# Patient Record
Sex: Male | Born: 1935 | Race: White | Hispanic: No | Marital: Married | Smoking: Current every day smoker
Health system: Southern US, Community
[De-identification: ages and names within clinical notes are randomized; demographics above are authoritative.]

## PROBLEM LIST (undated history)

## (undated) DIAGNOSIS — I714 Abdominal aortic aneurysm, without rupture, unspecified: Secondary | ICD-10-CM

## (undated) DIAGNOSIS — N35919 Unspecified urethral stricture, male, unspecified site: Secondary | ICD-10-CM

## (undated) DIAGNOSIS — N390 Urinary tract infection, site not specified: Secondary | ICD-10-CM

## (undated) DIAGNOSIS — G459 Transient cerebral ischemic attack, unspecified: Secondary | ICD-10-CM

## (undated) DIAGNOSIS — D494 Neoplasm of unspecified behavior of bladder: Secondary | ICD-10-CM

## (undated) DIAGNOSIS — H3530 Unspecified macular degeneration: Secondary | ICD-10-CM

## (undated) DIAGNOSIS — F0390 Unspecified dementia without behavioral disturbance: Secondary | ICD-10-CM

## (undated) HISTORY — DX: Abdominal aortic aneurysm, without rupture: I71.4

## (undated) HISTORY — DX: Neoplasm of unspecified behavior of bladder: D49.4

## (undated) HISTORY — PX: HERNIA REPAIR: SHX51

## (undated) HISTORY — DX: Abdominal aortic aneurysm, without rupture, unspecified: I71.40

## (undated) HISTORY — DX: Unspecified dementia, unspecified severity, without behavioral disturbance, psychotic disturbance, mood disturbance, and anxiety: F03.90

## (undated) HISTORY — DX: Unspecified urethral stricture, male, unspecified site: N35.919

## (undated) HISTORY — PX: CYST EXCISION: SHX5701

---

## 2001-11-28 ENCOUNTER — Ambulatory Visit (HOSPITAL_COMMUNITY): Admission: RE | Admit: 2001-11-28 | Discharge: 2001-11-29 | Payer: Self-pay | Admitting: Surgery

## 2008-05-19 ENCOUNTER — Encounter: Admission: RE | Admit: 2008-05-19 | Discharge: 2008-05-19 | Payer: Self-pay | Admitting: Neurosurgery

## 2011-01-07 NOTE — Op Note (Signed)
[place]  Patient:    Shawn [name], Shawn [name] Visit Number: [id number] MRN: [id number]          Service Type: DSU Location: [phone] Attending Physician:  Bonnetta Barry Dictated by:   Velora Heckler, M.D. Proc. Date: 11/28/01 Admit Date:  11/28/2001   CC:         Payton Doughty, M.D.  Colon Flattery, D.O.   Operative Report  PREOPERATIVE DIAGNOSIS:  Left inguinal hernia.  POSTOPERATIVE DIAGNOSIS:  Left inguinal hernia.  OPERATION PERFORMED:  Repair of left inguinal hernia with Prolene.  SURGEON:  Velora Heckler, M.D.  ANESTHESIA:  General.  ESTIMATED BLOOD LOSS:  Minimal.  PREPARATION:  Betadine.  COMPLICATIONS:  None.  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  This has been present for four to five years and gradually increasing in size.  The patient does note bowel sounds in the ground.  It has always been reducible.  He has had no symptoms of obstruction.  He now comes to surgery for herniorrhaphy.  DESCRIPTION OF PROCEDURE:  The procedure was done in OR #16 at the [facility].  The patient was brought to the operating room and placed in supine position on the operating room table.  Following administration of general anesthesia, the patient was prepped and draped in the usual strict aseptic fashion.  After ascertaining that an adequate level of anesthesia had been obtained, a left inguinal incision was made with a #10 blade.  Dissection was carried down through subcutaneous tissues and hemostasis was obtained with the electrocautery.  External oblique fascia was incised in line with its fibers and extended through the external inguinal ring.  Care was taken to preserve the ilioinguinal nerve.  Cord structures were dissected out.  Cord structures were encircled with a Penrose drain.  The floor of the inguinal canal was  dissected out.  There was a large direct inguinal hernia.  This was dissected out circumferentially defining the fascial plane.  The cord was explored and a small lipoma of the cord was excised at the level of the internal inguinal ring and ligated with a 3-0 Vicryl suture ligature.  The hernia was reduced and held in reduction with interrupted 3-0 Vicryl figure-of-eight sutures.  Next, the floor of the inguinal canal was recreated with a sheet of Prolene mesh.  The mesh was cut to the  appropriate dimensions.  It was secured to the pubic tubercle and along the inguinal ligament with a running 2-0 Novofil suture.  Fascia was split to accommodate the cord structures.  The superior margin of the mesh was secured with a transversalis and internal oblique fascia with interrupted 2-0 Novofil sutures.  The tails of the mesh were overlapped lateral to the cord structures and the inferior edges were secured to the inguinal ligament with a single interrupted 2-0 Novofil suture placed lateral to the cord structures. Good hemostasis was noted.  Local field block was placed with Marcaine.  The ilioinguinal nerve and cord structures were returned to the inguinal canal. The external oblique fascia was closed with interrupted 3-0 Vicryl sutures. Subcutaneous tissues were closed with interrupted 3-0 Vicryl sutures.  The skin edges were anesthetized with local anesthetic.  Skin edges were reapproximated with interrupted 4-0 Vicryl subcuticular sutures.  The wound was washed and dried and benzoin and Steri-Strips were applied.  Sterile gauze dressings were applied.  The patient was awakened from anesthesia and brought to the recovery room in stable condition.  The patient tolerated the procedure well. Dictated by:   Velora Heckler, M.D. Attending Physician:  Bonnetta Barry DD:  11/28/01 TD:  11/28/01 Job: [id number]

## 2011-08-23 DIAGNOSIS — G459 Transient cerebral ischemic attack, unspecified: Secondary | ICD-10-CM

## 2011-08-23 HISTORY — DX: Transient cerebral ischemic attack, unspecified: G45.9

## 2011-09-22 ENCOUNTER — Ambulatory Visit (HOSPITAL_COMMUNITY)
Admission: RE | Admit: 2011-09-22 | Discharge: 2011-09-22 | Disposition: A | Discharge status: Home / Self Care | Payer: Medicare Other | Source: Ambulatory Visit | Attending: Neurosurgery | Admitting: Neurosurgery

## 2011-09-22 DIAGNOSIS — G459 Transient cerebral ischemic attack, unspecified: Secondary | ICD-10-CM

## 2011-09-22 NOTE — Progress Notes (Signed)
Bilateral carotid artery duplex completed.  Preliminary report is 60-79% ICA stenosis on the right, possibly due to vessel tortuosity, and no evidence of significant stenosis on the left.

## 2013-09-25 ENCOUNTER — Encounter: Payer: Self-pay | Admitting: Vascular Surgery

## 2013-09-29 ENCOUNTER — Observation Stay (HOSPITAL_COMMUNITY)
Admission: EM | Admit: 2013-09-29 | Discharge: 2013-09-30 | Disposition: A | Discharge status: Home / Self Care | Payer: Medicare Other | Source: Home / Self Care | Attending: Emergency Medicine | Admitting: Emergency Medicine

## 2013-09-29 ENCOUNTER — Emergency Department (HOSPITAL_COMMUNITY): Payer: Medicare Other

## 2013-09-29 DIAGNOSIS — Z23 Encounter for immunization: Secondary | ICD-10-CM

## 2013-09-29 DIAGNOSIS — D494 Neoplasm of unspecified behavior of bladder: Principal | ICD-10-CM | POA: Diagnosis present

## 2013-09-29 DIAGNOSIS — N390 Urinary tract infection, site not specified: Secondary | ICD-10-CM

## 2013-09-29 DIAGNOSIS — I10 Essential (primary) hypertension: Secondary | ICD-10-CM | POA: Diagnosis present

## 2013-09-29 DIAGNOSIS — D649 Anemia, unspecified: Secondary | ICD-10-CM

## 2013-09-29 DIAGNOSIS — Z8673 Personal history of transient ischemic attack (TIA), and cerebral infarction without residual deficits: Secondary | ICD-10-CM

## 2013-09-29 DIAGNOSIS — I714 Abdominal aortic aneurysm, without rupture, unspecified: Secondary | ICD-10-CM | POA: Diagnosis present

## 2013-09-29 DIAGNOSIS — R319 Hematuria, unspecified: Secondary | ICD-10-CM

## 2013-09-29 DIAGNOSIS — G309 Alzheimer's disease, unspecified: Secondary | ICD-10-CM | POA: Diagnosis present

## 2013-09-29 DIAGNOSIS — R339 Retention of urine, unspecified: Secondary | ICD-10-CM | POA: Diagnosis not present

## 2013-09-29 DIAGNOSIS — R310 Gross hematuria: Secondary | ICD-10-CM | POA: Diagnosis present

## 2013-09-29 DIAGNOSIS — F17200 Nicotine dependence, unspecified, uncomplicated: Secondary | ICD-10-CM | POA: Diagnosis present

## 2013-09-29 DIAGNOSIS — Z820 Family history of epilepsy and other diseases of the nervous system: Secondary | ICD-10-CM

## 2013-09-29 DIAGNOSIS — D696 Thrombocytopenia, unspecified: Secondary | ICD-10-CM | POA: Diagnosis not present

## 2013-09-29 DIAGNOSIS — R32 Unspecified urinary incontinence: Secondary | ICD-10-CM | POA: Diagnosis not present

## 2013-09-29 DIAGNOSIS — N401 Enlarged prostate with lower urinary tract symptoms: Secondary | ICD-10-CM | POA: Diagnosis present

## 2013-09-29 DIAGNOSIS — F0280 Dementia in other diseases classified elsewhere without behavioral disturbance: Secondary | ICD-10-CM | POA: Diagnosis present

## 2013-09-29 DIAGNOSIS — N138 Other obstructive and reflux uropathy: Secondary | ICD-10-CM | POA: Diagnosis present

## 2013-09-29 DIAGNOSIS — D62 Acute posthemorrhagic anemia: Secondary | ICD-10-CM | POA: Diagnosis present

## 2013-09-29 DIAGNOSIS — F411 Generalized anxiety disorder: Secondary | ICD-10-CM | POA: Diagnosis present

## 2013-09-29 HISTORY — DX: Transient cerebral ischemic attack, unspecified: G45.9

## 2013-09-29 HISTORY — DX: Unspecified macular degeneration: H35.30

## 2013-09-29 MED ORDER — IOHEXOL 350 MG/ML SOLN
100.0000 mL | Freq: Once | INTRAVENOUS | Status: DC | PRN
  Administered 2013-09-29: 100 mL via INTRAVENOUS

## 2013-09-29 MED ORDER — SODIUM CHLORIDE 0.9 % IV BOLUS (SEPSIS)
500.0000 mL | INTRAVENOUS | Status: DC
  Administered 2013-09-29: 500 mL via INTRAVENOUS

## 2013-09-29 MED ORDER — DEXTROSE 5 % IV SOLN
1.0000 g | Freq: Once | INTRAVENOUS | Status: DC
  Administered 2013-09-29: 1 g via INTRAVENOUS
  Filled 2013-09-29: qty 10

## 2013-09-29 NOTE — ED Provider Notes (Signed)
CSN: [id number]     Arrival date & time 09/29/13  1903 History   First MD Initiated Contact with Patient 09/29/13 1931     Chief Complaint  Patient presents with  . Hematuria  . Urinary Retention   (Consider location/radiation/quality/duration/timing/severity/associated sxs/prior Treatment) Patient is a 80 y.o. male presenting with hematuria. The history is provided by the patient and the spouse.  Hematuria This is a new problem. The current episode started more than 1 week ago. The problem occurs constantly. The problem has not changed since onset.Pertinent negatives include no chest pain, no abdominal pain, no headaches and no shortness of breath. Nothing aggravates the symptoms. Nothing relieves the symptoms. He has tried nothing for the symptoms. The treatment provided no relief.    Past Medical History  Diagnosis Date  . TIA (transient ischemic attack)   . Macular degeneration    Past Surgical History  Procedure Laterality Date  . Hernia repair     History reviewed. No pertinent family history. History  Substance Use Topics  . Smoking status: Current Every Day Smoker  . Smokeless tobacco: Not on file  . Alcohol Use: No    Review of Systems  Constitutional: Negative for fever.  HENT: Negative for drooling and rhinorrhea.   Eyes: Negative for pain.  Respiratory: Negative for cough and shortness of breath.   Cardiovascular: Negative for chest pain and leg swelling.  Gastrointestinal: Negative for nausea, vomiting, abdominal pain and diarrhea.  Genitourinary: Positive for hematuria. Negative for dysuria.  Musculoskeletal: Negative for gait problem and neck pain.  Skin: Negative for color change.  Neurological: Positive for dizziness. Negative for numbness and headaches.  Hematological: Negative for adenopathy.  Psychiatric/Behavioral: Negative for behavioral problems.  All other systems reviewed and are negative.    Allergies  Review of patient's allergies indicates  no known allergies.  Home Medications  No current outpatient prescriptions on file. BP 163/78  Pulse 89  Temp(Src) 98 F (36.7 C) (Oral)  Resp 27  SpO2 98% Physical Exam  Nursing note and vitals reviewed. Constitutional: He is oriented to person, place, and time. He appears well-developed and well-nourished.  HENT:  Head: Normocephalic and atraumatic.  Right Ear: External ear normal.  Left Ear: External ear normal.  Nose: Nose normal.  Mouth/Throat: Oropharynx is clear and moist. No oropharyngeal exudate.  Eyes: Conjunctivae and EOM are normal. Pupils are equal, round, and reactive to light.  Neck: Normal range of motion. Neck supple.  Cardiovascular: Normal rate, regular rhythm, normal heart sounds and intact distal pulses.  Exam reveals no gallop and no friction rub.   No murmur heard. Pulmonary/Chest: Effort normal and breath sounds normal. No respiratory distress. He has no wheezes.  Abdominal: Soft. Bowel sounds are normal. He exhibits no distension. There is no tenderness. There is no rebound and no guarding.  Genitourinary: Penis normal. No penile tenderness.  Normal appearing testicles.  Musculoskeletal: Normal range of motion. He exhibits no edema and no tenderness.  Neurological: He is alert and oriented to person, place, and time.  Skin: Skin is warm and dry.  Psychiatric: He has a normal mood and affect. His behavior is normal.    ED Course  Procedures (including critical care time) Labs Review Labs Reviewed  CBC WITH DIFFERENTIAL - Abnormal; Notable for the following:    RBC 2.69 (*)    Hemoglobin 8.0 (*)    HCT 24.5 (*)    RDW 16.2 (*)    Platelets 59 (*)    All  other components within normal limits  COMPREHENSIVE METABOLIC PANEL - Abnormal; Notable for the following:    Glucose, Bld 123 (*)    GFR calc non Af Amer 77 (*)    GFR calc Af Amer 90 (*)    All other components within normal limits   Imaging Review No results found.  EKG Interpretation    None       MDM   1. Hematuria   2. UTI (lower urinary tract infection)   3. AAA (abdominal aortic aneurysm)   4. Anemia    8:01 PM 80 y.o. male presents with hematuria which began approximately one week ago. The patient began having hematuria back in December of 2014. He had a CT scan 2 weeks ago which found a abdominal aortic aneurysm 59 x 57 mm and a tumor on his bladder wall which was suspected to be the cause of the bleeding. The family notes that he has had continued and worsening hematuria and presented for evaluation due to this. The patient denies any pain in the family states that he has been well otherwise. He is afebrile and vital signs are unremarkable here. He has no complaints on exam. Will get screening labwork and CT of abd to r/o any worsening of AAA or fistula given worsening of hematuria.   Will plan on admission given anemia and worsening hematuria. Case discussed w/ on call Urology who will see the patient tomorrow.    Blanchard Kelch, MD 10/11/13 1104

## 2013-09-29 NOTE — ED Notes (Signed)
Pt still waiting for  c-t.  Rocephin has infused

## 2013-09-29 NOTE — ED Notes (Signed)
The pt has had bloody urine since January.  He was diagnosed with a tumor in his bladder but no surgery can be performed until he has a repair of his aaa.  More blood today with clots.  No pain according to the family at the bedside.  The pt has dementia.  The pt is alert no distress comfortable

## 2013-09-29 NOTE — ED Notes (Addendum)
C/o hematuria and urinary retention, reports some pain, sx intermittant since 07/31/13. Has seen urology. Recent CT scan shows surgical bladder tumor. Reports some pain. Wife present. Describes pt as fall risk, pt has alzheimers. Wife has disc of CT done at [place].  

## 2013-09-29 NOTE — ED Notes (Signed)
Assisted pt with using urinal pt was unable to urinate.

## 2013-09-29 NOTE — ED Notes (Signed)
Patient transported to CT 

## 2013-09-29 NOTE — ED Notes (Signed)
The pt continues to c/o needing to urinate.  Family remains at the bedside

## 2013-09-30 ENCOUNTER — Emergency Department (HOSPITAL_COMMUNITY): Payer: Medicare Other

## 2013-09-30 ENCOUNTER — Encounter (HOSPITAL_COMMUNITY): Payer: Self-pay | Admitting: Radiology

## 2013-09-30 ENCOUNTER — Inpatient Hospital Stay (HOSPITAL_COMMUNITY)
Admission: EM | Admit: 2013-09-30 | Discharge: 2013-10-03 | DRG: 669 | Disposition: A | Discharge status: Home / Self Care | Payer: Medicare Other | Attending: Internal Medicine | Admitting: Internal Medicine

## 2013-09-30 ENCOUNTER — Observation Stay (HOSPITAL_COMMUNITY): Payer: Medicare Other

## 2013-09-30 DIAGNOSIS — I714 Abdominal aortic aneurysm, without rupture, unspecified: Secondary | ICD-10-CM

## 2013-09-30 DIAGNOSIS — N390 Urinary tract infection, site not specified: Secondary | ICD-10-CM

## 2013-09-30 DIAGNOSIS — G309 Alzheimer's disease, unspecified: Secondary | ICD-10-CM

## 2013-09-30 DIAGNOSIS — D62 Acute posthemorrhagic anemia: Secondary | ICD-10-CM

## 2013-09-30 DIAGNOSIS — R319 Hematuria, unspecified: Secondary | ICD-10-CM | POA: Diagnosis present

## 2013-09-30 DIAGNOSIS — F0280 Dementia in other diseases classified elsewhere without behavioral disturbance: Secondary | ICD-10-CM | POA: Diagnosis present

## 2013-09-30 DIAGNOSIS — N3289 Other specified disorders of bladder: Secondary | ICD-10-CM | POA: Diagnosis present

## 2013-09-30 HISTORY — DX: Urinary tract infection, site not specified: N39.0

## 2013-09-30 HISTORY — DX: Transient cerebral ischemic attack, unspecified: G45.9

## 2013-09-30 HISTORY — DX: Unspecified macular degeneration: H35.30

## 2013-09-30 LAB — COMPREHENSIVE METABOLIC PANEL
ALK PHOS: 60 U/L (ref 39–117)
ALT: 14 U/L (ref 0–53)
ALT: 11 U/L (ref 0–53)
AST: 22 U/L (ref 0–37)
AST: 17 U/L (ref 0–37)
Albumin: 4 g/dL (ref 3.5–5.2)
Albumin: 3.8 g/dL (ref 3.5–5.2)
Alkaline Phosphatase: 66 U/L (ref 39–117)
BUN: 11 mg/dL (ref 6–23)
BUN: 10 mg/dL (ref 6–23)
CALCIUM: 8.6 mg/dL (ref 8.4–10.5)
CO2: 26 mEq/L (ref 19–32)
CO2: 24 meq/L (ref 19–32)
Calcium: 9.1 mg/dL (ref 8.4–10.5)
Chloride: 104 mEq/L (ref 96–112)
Chloride: 103 mEq/L (ref 96–112)
Creatinine, Ser: 0.94 mg/dL (ref 0.50–1.35)
Creatinine, Ser: 0.85 mg/dL (ref 0.50–1.35)
GFR calc Af Amer: 90 mL/min — ABNORMAL LOW (ref >90)
GFR calc Af Amer: >90 mL/min (ref >90)
GFR calc non Af Amer: 77 mL/min — ABNORMAL LOW (ref >90)
GFR, EST NON AFRICAN AMERICAN: 81 mL/min — AB (ref >90)
GLUCOSE: 124 mg/dL — AB (ref 70–99)
Glucose, Bld: 123 mg/dL — ABNORMAL HIGH (ref 70–99)
POTASSIUM: 4.1 meq/L (ref 3.7–5.3)
Potassium: 4.4 mEq/L (ref 3.7–5.3)
SODIUM: 141 meq/L (ref 137–147)
Sodium: 143 mEq/L (ref 137–147)
Total Bilirubin: 0.4 mg/dL (ref 0.3–1.2)
Total Bilirubin: 0.4 mg/dL (ref 0.3–1.2)
Total Protein: 7.2 g/dL (ref 6.0–8.3)
Total Protein: 6.8 g/dL (ref 6.0–8.3)

## 2013-09-30 LAB — URINALYSIS, ROUTINE W REFLEX MICROSCOPIC
Glucose, UA: NEGATIVE mg/dL
KETONES UR: 40 mg/dL — AB
Nitrite: POSITIVE — AB
PH: 6.5 (ref 5.0–8.0)
Protein, ur: >300 mg/dL — AB
SPECIFIC GRAVITY, URINE: 1.018 (ref 1.005–1.030)
UROBILINOGEN UA: 1 mg/dL (ref 0.0–1.0)

## 2013-09-30 LAB — URINE MICROSCOPIC-ADD ON

## 2013-09-30 LAB — CBC
HCT: 23.5 % — ABNORMAL LOW (ref 39.0–52.0)
HCT: 21.8 % — ABNORMAL LOW (ref 39.0–52.0)
HEMATOCRIT: 22.5 % — AB (ref 39.0–52.0)
HEMOGLOBIN: 7.9 g/dL — AB (ref 13.0–17.0)
HEMOGLOBIN: 7.5 g/dL — AB (ref 13.0–17.0)
Hemoglobin: 7.1 g/dL — ABNORMAL LOW (ref 13.0–17.0)
MCH: 30.7 pg (ref 26.0–34.0)
MCH: 30.5 pg (ref 26.0–34.0)
MCH: 29.7 pg (ref 26.0–34.0)
MCHC: 33.6 g/dL (ref 30.0–36.0)
MCHC: 33.3 g/dL (ref 30.0–36.0)
MCHC: 32.6 g/dL (ref 30.0–36.0)
MCV: 91.4 fL (ref 78.0–100.0)
MCV: 91.5 fL (ref 78.0–100.0)
MCV: 91.2 fL (ref 78.0–100.0)
Platelets: 45 10*3/uL — ABNORMAL LOW (ref 150–400)
Platelets: 41 10*3/uL — ABNORMAL LOW (ref 150–400)
Platelets: 46 10*3/uL — ABNORMAL LOW (ref 150–400)
RBC: 2.57 MIL/uL — ABNORMAL LOW (ref 4.22–5.81)
RBC: 2.46 MIL/uL — ABNORMAL LOW (ref 4.22–5.81)
RBC: 2.39 MIL/uL — ABNORMAL LOW (ref 4.22–5.81)
RDW: 16.3 % — ABNORMAL HIGH (ref 11.5–15.5)
RDW: 16.2 % — ABNORMAL HIGH (ref 11.5–15.5)
RDW: 16.4 % — ABNORMAL HIGH (ref 11.5–15.5)
WBC: 7.9 10*3/uL (ref 4.0–10.5)
WBC: 6.9 10*3/uL (ref 4.0–10.5)
WBC: 5.4 10*3/uL (ref 4.0–10.5)

## 2013-09-30 LAB — POCT I-STAT, CHEM 8
BUN: 8 mg/dL (ref 6–23)
Calcium, Ion: 1.18 mmol/L (ref 1.13–1.30)
Chloride: 102 mEq/L (ref 96–112)
Creatinine, Ser: 1 mg/dL (ref 0.50–1.35)
Glucose, Bld: 119 mg/dL — ABNORMAL HIGH (ref 70–99)
HEMATOCRIT: 26 % — AB (ref 39.0–52.0)
HEMOGLOBIN: 8.8 g/dL — AB (ref 13.0–17.0)
Potassium: 4 mEq/L (ref 3.7–5.3)
SODIUM: 142 meq/L (ref 137–147)
TCO2: 25 mmol/L (ref 0–100)

## 2013-09-30 LAB — CBC WITH DIFFERENTIAL/PLATELET
BASOS ABS: 0 10*3/uL (ref 0.0–0.1)
Basophils Absolute: 0 10*3/uL (ref 0.0–0.1)
Basophils Relative: 0 % (ref 0–1)
Basophils Relative: 0 % (ref 0–1)
EOS ABS: 0 10*3/uL (ref 0.0–0.7)
Eosinophils Absolute: 0.1 10*3/uL (ref 0.0–0.7)
Eosinophils Relative: 1 % (ref 0–5)
Eosinophils Relative: 0 % (ref 0–5)
HCT: 24.5 % — ABNORMAL LOW (ref 39.0–52.0)
HCT: 24.7 % — ABNORMAL LOW (ref 39.0–52.0)
Hemoglobin: 8 g/dL — ABNORMAL LOW (ref 13.0–17.0)
Hemoglobin: 8.1 g/dL — ABNORMAL LOW (ref 13.0–17.0)
LYMPHS PCT: 27 % (ref 12–46)
Lymphocytes Relative: 24 % (ref 12–46)
Lymphs Abs: 1.5 10*3/uL (ref 0.7–4.0)
Lymphs Abs: 1.4 10*3/uL (ref 0.7–4.0)
MCH: 29.7 pg (ref 26.0–34.0)
MCH: 30 pg (ref 26.0–34.0)
MCHC: 32.7 g/dL (ref 30.0–36.0)
MCHC: 32.8 g/dL (ref 30.0–36.0)
MCV: 91.1 fL (ref 78.0–100.0)
MCV: 91.5 fL (ref 78.0–100.0)
Monocytes Absolute: 0.5 10*3/uL (ref 0.1–1.0)
Monocytes Absolute: 0.4 10*3/uL (ref 0.1–1.0)
Monocytes Relative: 8 % (ref 3–12)
Monocytes Relative: 7 % (ref 3–12)
NEUTROS PCT: 66 % (ref 43–77)
Neutro Abs: 4.2 10*3/uL (ref 1.7–7.7)
Neutro Abs: 3.4 10*3/uL (ref 1.7–7.7)
Neutrophils Relative %: 67 % (ref 43–77)
Platelets: 59 10*3/uL — ABNORMAL LOW (ref 150–400)
Platelets: 55 10*3/uL — ABNORMAL LOW (ref 150–400)
RBC: 2.69 MIL/uL — ABNORMAL LOW (ref 4.22–5.81)
RBC: 2.7 MIL/uL — ABNORMAL LOW (ref 4.22–5.81)
RDW: 16.2 % — ABNORMAL HIGH (ref 11.5–15.5)
RDW: 16.3 % — AB (ref 11.5–15.5)
WBC: 6.3 10*3/uL (ref 4.0–10.5)
WBC: 5.2 10*3/uL (ref 4.0–10.5)

## 2013-09-30 LAB — ABO/RH: ABO/RH(D): A POS

## 2013-09-30 MED ORDER — ACETAMINOPHEN 325 MG PO TABS
650.0000 mg | ORAL_TABLET | Freq: Four times a day (QID) | ORAL | Status: DC | PRN
  Administered 2013-10-01 – 2013-10-02 (×2): 650 mg via ORAL
  Filled 2013-09-30 (×2): qty 2

## 2013-09-30 MED ORDER — ALPRAZOLAM 0.5 MG PO TABS
0.5000 mg | ORAL_TABLET | Freq: Three times a day (TID) | ORAL | Status: DC | PRN
  Administered 2013-09-30: 0.5 mg via ORAL
  Filled 2013-09-30: qty 1

## 2013-09-30 MED ORDER — IOHEXOL 350 MG/ML SOLN
100.0000 mL | Freq: Once | INTRAVENOUS | Status: AC | PRN
  Administered 2013-09-30: 100 mL via INTRAVENOUS

## 2013-09-30 MED ORDER — SODIUM CHLORIDE 0.9 % IV BOLUS (SEPSIS)
500.0000 mL | Freq: Once | INTRAVENOUS | Status: AC
  Administered 2013-09-29: 500 mL via INTRAVENOUS

## 2013-09-30 MED ORDER — DEXTROSE 5 % IV SOLN
1.0000 g | INTRAVENOUS | Status: DC
  Administered 2013-09-30 – 2013-10-01 (×2): 1 g via INTRAVENOUS
  Filled 2013-09-30 (×3): qty 10

## 2013-09-30 MED ORDER — INFLUENZA VAC SPLIT QUAD 0.5 ML IM SUSP
0.5000 mL | INTRAMUSCULAR | Status: AC
  Administered 2013-10-01: 0.5 mL via INTRAMUSCULAR
  Filled 2013-09-30: qty 0.5

## 2013-09-30 MED ORDER — ONDANSETRON HCL 4 MG PO TABS: 4.0000 mg | ORAL_TABLET | Freq: Four times a day (QID) | ORAL | Status: DC | PRN

## 2013-09-30 MED ORDER — ONDANSETRON HCL 4 MG/2ML IJ SOLN: 4.0000 mg | Freq: Four times a day (QID) | INTRAMUSCULAR | Status: DC | PRN

## 2013-09-30 MED ORDER — ACETAMINOPHEN 650 MG RE SUPP: 650.0000 mg | Freq: Four times a day (QID) | RECTAL | Status: DC | PRN

## 2013-09-30 MED ORDER — OCUVITE PO TABS
1.0000 | ORAL_TABLET | Freq: Every day | ORAL | Status: DC
  Administered 2013-09-30 – 2013-10-03 (×3): 1 via ORAL
  Filled 2013-09-30 (×4): qty 1

## 2013-09-30 MED ORDER — SODIUM CHLORIDE 0.9 % IV SOLN
INTRAVENOUS | Status: AC
  Administered 2013-09-30 – 2013-10-01 (×3): via INTRAVENOUS

## 2013-09-30 MED ORDER — PNEUMOCOCCAL VAC POLYVALENT 25 MCG/0.5ML IJ INJ
0.5000 mL | INJECTION | INTRAMUSCULAR | Status: AC
  Administered 2013-10-01: 0.5 mL via INTRAMUSCULAR
  Filled 2013-09-30: qty 0.5

## 2013-09-30 MED ORDER — DEXTROSE 5 % IV SOLN
1.0000 g | Freq: Once | INTRAVENOUS | Status: AC
  Administered 2013-09-29: 1 g via INTRAVENOUS

## 2013-09-30 MED ORDER — ALPRAZOLAM 0.5 MG PO TABS
1.0000 mg | ORAL_TABLET | Freq: Two times a day (BID) | ORAL | Status: DC | PRN
  Administered 2013-09-30 – 2013-10-03 (×6): 1 mg via ORAL
  Filled 2013-09-30 (×6): qty 2

## 2013-09-30 NOTE — ED Notes (Signed)
Unable to placed Iv in patient arm one  Fail attempt

## 2013-09-30 NOTE — ED Notes (Signed)
The pt has had bloody urine since January when he was diagnosed with a mass in his bladder that cannot be repaired until  His aaa is repaired.  The blood ahd been bright with clotting for the past 2 days worse today with frequency and voiding in small amounts

## 2013-09-30 NOTE — Progress Notes (Signed)
Pt continues to have dark red urine.

## 2013-09-30 NOTE — H&P (Signed)
Please note patient has another MRN: [id number]  Patient's PCP: Glenna Fellows, MD Patient's urologist: Dr. Clyde Lundborg, [facility]  Chief Complaint: Blood in urine  History of Present Illness: Shawn [name] is a 80 y.o. Caucasian male with history of dementia, recent diagnoses of abdominal aortic aneurysm, and bladder mass who presents with the above complaints.  Patient due to his dementia and cannot provide any history, most of the history was provided by wife at bedside.  She indicated that patient had hematuria in December of 2014 which lasted for one week to 10 days.  She followed up with Dr. Exie Parody, urology.  Patient had CT done about 2 weeks ago which showed that he had abdominal aortic aneurysm measuring 59 x 57 mm and the tumor in his bladder wall.  Due to patient's abdominal aortic aneurysm, urology deferred further urologic workup until abdominal aortic aneurysm was repaired.  Wife are arranged for the patient to have an appointment with Dr. Donnetta Hutching, vascular surgery in the middle of February.  In the meanwhile, patient restarted having hematuria early January initially was pink tinged since then has progressed bright red blood.  As a result patient was brought to the emergency department for further evaluation.  Workup in the emergency department showed infrarenal abdominal aortic aneurysm measuring 62 mm x 57 mm and bladder mass tumor.  He was also found to be anemic with a hemoglobin of 8.1.  ED provider discussed with on-call urology who recommended medical admission.  Review of Systems: All systems reviewed with the patient and positive as per history of present illness, otherwise all other systems are negative.  Past Medical History  Diagnosis Date  . AAA (abdominal aortic aneurysm)   . Dementia, unspecified, without behavioral disturbance   . Neoplasm of unspecified nature of bladder   . Urethral stricture unspecified    History reviewed. No pertinent past surgical history. Family  History  Problem Relation Age of Onset  . Alzheimer's disease Mother   . Suicidality Father    History   Social History  . Marital Status: Married    Spouse Name: N/A    Number of Children: N/A  . Years of Education: N/A   Occupational History  . Not on file.   Social History Main Topics  . Smoking status: Current Every Day Smoker -- 1.00 packs/day  . Smokeless tobacco: Not on file  . Alcohol Use: No  . Drug Use: Not on file  . Sexual Activity: Not on file   Other Topics Concern  . Not on file   Social History Narrative  . No narrative on file   Allergies: Review of patient's allergies indicates no known allergies.  Home Meds: Prior to Admission medications   Medication Sig Start Date End Date Taking? Authorizing Provider  ALPRAZolam Duanne Moron) 0.5 MG tablet Take 0.5 mg by mouth 2 (two) times daily.  09/25/13  Yes Historical Provider, MD  beta carotene w/minerals (OCUVITE) tablet Take 1 tablet by mouth daily.   Yes Historical Provider, MD    Physical Exam: Blood pressure 165/80, pulse 77, temperature 98 F (36.7 C), temperature source Oral, resp. rate 20, SpO2 94.00%. General: Awake, not oriented x3, No acute distress. HEENT: EOMI, Moist mucous membranes Neck: Supple CV: S1 and S2 Lungs: Clear to ascultation bilaterally Abdomen: Soft, Nontender, Nondistended, +bowel sounds. Ext: Good pulses. Trace edema. No clubbing or cyanosis noted. Neuro: Cranial Nerves II-XII grossly intact. Has 5/5 motor strength in upper and lower extremities.  Lab results:  Recent Labs  09/30/13 0133 09/30/13 0138  NA 141 142  K 4.1 4.0  CL 103 102  CO2 24  --   GLUCOSE 124* 119*  BUN 10 8  CREATININE 0.85 1.00  CALCIUM 8.6  --     Recent Labs  09/30/13 0133  AST 17  ALT 11  ALKPHOS 60  BILITOT 0.4  PROT 6.8  ALBUMIN 3.8   No results found for this basename: LIPASE, AMYLASE,  in the last 72 hours  Recent Labs  09/30/13 0133 09/30/13 0138  WBC 5.2  --   NEUTROABS  3.4  --   HGB 8.1* 8.8*  HCT 24.7* 26.0*  MCV 91.5  --   PLT 55*  --    No results found for this basename: CKTOTAL, CKMB, CKMBINDEX, TROPONINI,  in the last 72 hours No components found with this basename: POCBNP,  No results found for this basename: DDIMER,  in the last 72 hours No results found for this basename: HGBA1C,  in the last 72 hours No results found for this basename: CHOL, HDL, LDLCALC, TRIG, CHOLHDL, LDLDIRECT,  in the last 72 hours No results found for this basename: TSH, T4TOTAL, FREET3, T3FREE, THYROIDAB,  in the last 72 hours No results found for this basename: VITAMINB12, FOLATE, FERRITIN, TIBC, IRON, RETICCTPCT,  in the last 72 hours Imaging results:  Ct Cta Abd/pel W/cm &/or W/o Cm  09/30/2013   CLINICAL DATA:  Hematuria in urinary retention. Abdominal aortic aneurysm.  EXAM: CT ANGIOGRAPHY ABDOMEN AND PELVIS WITH CONTRAST AND WITHOUT CONTRAST  TECHNIQUE: Multidetector CT imaging of the abdomen and pelvis was performed using the standard protocol during bolus administration of intravenous contrast. Multiplanar reconstructed images including MIPs were obtained and reviewed to evaluate the vascular anatomy.  CONTRAST:  12mL OMNIPAQUE IOHEXOL 350 MG/ML SOLN  COMPARISON:  09/18/2013.  FINDINGS: Lung bases show dependent atelectasis. Liver is grossly within normal limits. Sludge layers within a Phrygian cap in the gallbladder. The spleen appears within normal limits. Adrenal glands are normal. Kidneys appear within normal limits aside from a left renal cysts. Ureters appear normal without hydronephrosis or hydroureter. Grossly, the stomach and small bowel appear normal. Pancreas and common bile duct are within normal limits.  Infrarenal abdominal aortic aneurysm is present. There is severe aortic and visceral atherosclerosis. 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 No  occlusion are as evidence of mesenteric ischemia.  Infrarenal abdominal aortic aneurysm shows no evidence of rupture or leak. Aneurysm measures 62 mm x 57 mm at its largest. Previously this was reported at 16 mm and this is unchanged when reviewing the prior examination and measured at the same level as today's study. Extensive mural thrombus and plaque is present. The aorta assumes a more normal caliber at the bifurcation. Right common iliac artery ulcerated plaque is present. There is no dissection.  There is a right inguinal hernia that contains the appendix.  Urinary bladder demonstrates soft tissue density along the right lateral and posterior wall compatible with known bladder tumor. Prostatomegaly with prostate calcifications. No aggressive osseous lesions. Lumbar spondylosis. Bilateral hip osteoarthritis.  Review of the MIP images confirms the above findings.  IMPRESSION: 1. 62 mm x 57 mm infrarenal abdominal aortic aneurysm. No dissection or evidence of rupture. No acute vascular findings. 2. Bladder base tumor previously evaluated on CT urogram. 3. Right inguinal hernia containing the appendix.   Electronically Signed  By: Dereck Ligas M.D.   On: 09/30/2013 02:44   Assessment & Plan by Problem: Hematuria likely due to bladder mass ED physician noted that the urologist did not want catheter at this time and wanted his urinary tract infection addressed.  Hydrate the patient on IV fluids.  Cycle CBC.  Will type and screen the patient in the event he may need transfusion.  Urology may need to be reconsulted as patient was on a different name emergency department due to error in registration.  Urinary tract infection Start the patient on ceftriaxone.  Further titration of an otherwise depending on culture results and sensitivities.  Thrombocytopenia? Unclear etiology.  Uncertain if this is related to hematuria.  Continue to monitor, if still low, may need further evaluation.  Acute blood loss  anemia As indicated above, continue to monitor CBC.  Abdominal aortic aneurysm Patient will need vascular surgery evaluation in the morning.  Wife would like to have Dr. Donnetta Hutching perform any interventions if needed.  Bladder mass Management as per urology.  Dementia Stable.  Anxiety Continue home Xanax, unchanged from schedule that as needed.  Prophylaxis SCDs, no heparin products given hematuria.  CODE STATUS Full code.  Wife indicated that she will discuss with family before getting back to [place] regarding CODE STATUS.  Disposition Admit the patient as inpatient medical bed.  Time spent on admission, talking to the patient, and coordinating care was: 50 mins.  Login Muckleroy A, MD 09/30/2013, 5:23 AM

## 2013-09-30 NOTE — ED Notes (Signed)
526ml nss has infused

## 2013-09-30 NOTE — ED Notes (Addendum)
UNALBLE TO PLACED IV IN PATIENT ARM ONE FAIL ATTEMPT

## 2013-09-30 NOTE — ED Notes (Signed)
i called c-t  They will get the pt  soon

## 2013-09-30 NOTE — ED Notes (Signed)
nss bolus and recephin has infused.  Pt waiting on c-t

## 2013-09-30 NOTE — Consult Note (Signed)
Urology Consult   Physician requesting consult: Dr. Cruzita Lederer, [place]  Reason for consult: gross hematuria  History of Present Illness: Shawn [name] is a 80 y.o.  male with dementia. He was first noted to have gross hematuria in Dec 2014 and had a CT scan performed 2 weeks ago which incidentally showed a 6.2 cm X 5.7 cm infrarenal abdominal aortic aneurysm and a bladder tumor along the right lateral and posterior wall and hematuria was thought to have been caused by the mass.  His urologist, Dr. Exie Parody, was planning to perform a TURBT but this is on hold due to the abdomina aortic aneurym finding.  He was noted to have significant gross hematuria one week ago and was brought to the ER yesterday for evaluation.   The patient's family was not available and he was unable to give any medical history.   He is not on any hypertensive, hypercholesterolemia, or DM medications.  He is not on any blood thinners.  He used to be a smoker.     Past Medical History  Diagnosis Date  . AAA (abdominal aortic aneurysm)   . Dementia, unspecified, without behavioral disturbance   . Neoplasm of unspecified nature of bladder   . Urethral stricture unspecified     History reviewed. No pertinent past surgical history.  Current Hospital Medications:  Home Meds:    Medication List    ASK your doctor about these medications       ALPRAZolam 0.5 MG tablet  Commonly known as:  XANAX  Take 0.5 mg by mouth 2 (two) times daily.     beta carotene w/minerals tablet  Take 1 tablet by mouth daily.        Scheduled Meds: . beta carotene w/minerals  1 tablet Oral Daily  . cefTRIAXone (ROCEPHIN)  IV  1 g Intravenous Q24H   Continuous Infusions: . sodium chloride 100 mL/hr at 09/30/13 0657   PRN Meds:.acetaminophen, acetaminophen, ALPRAZolam, ondansetron (ZOFRAN) IV, ondansetron  Allergies: No Known Allergies  Family History  Problem Relation Age of Onset  . Alzheimer's disease Mother   .  Suicidality Father     Social History:  reports that he has been smoking.  He does not have any smokeless tobacco history on file. He reports that he does not drink alcohol. His drug history is not on file.  ROS: He c/o suprapubic pain with palpation and dysuria but otherwise no pain.   All other systems are negative except for pertinent findings as noted.  Physical Exam:  Vital signs in last 24 hours: Temp:  [97.3 F (36.3 C)-98.2 F (36.8 C)] 97.3 F (36.3 C) (02/09 1356) Pulse Rate:  [58-98] 77 (02/09 1356) Resp:  [14-27] 20 (02/09 1356) BP: (149-178)/(65-89) 167/79 mmHg (02/09 1356) SpO2:  [94 %-100 %] 98 % (02/09 1356) Weight:  [82.5 kg (181 lb 14.1 oz)] 82.5 kg (181 lb 14.1 oz) (02/09 0641) Constitutional:  Demented but pleasant.  No acute distress  Respiratory: Normal respiratory effort GI: Abdomen is slightly distended but soft, tender to touch, no abdominal masses GU:  Supapubic tenderness w/ palpation but no CVA tenderness  Laboratory Data:   Recent Labs  09/29/13 2035 09/30/13 0133 09/30/13 0138 09/30/13 1050  WBC 6.3 5.2  --  7.9  HGB 8.0* 8.1* 8.8* 7.9*  HCT 24.5* 24.7* 26.0* 23.5*  PLT 59* 55*  --  45*     Recent Labs  09/29/13 2035 09/30/13 0133 09/30/13 0138  NA 143 141 142  K 4.4  4.1 4.0  CL 104 103 102  GLUCOSE 123* 124* 119*  BUN 11 10 8   CALCIUM 9.1 8.6  --   CREATININE 0.94 0.85 1.00     Results for orders placed during the hospital encounter of 09/30/13 (from the past 24 hour(s))  URINALYSIS, ROUTINE W REFLEX MICROSCOPIC     Status: Abnormal   Collection Time    09/30/13  1:30 AM      Result Value Range   Color, Urine RED (*) YELLOW   APPearance TURBID (*) CLEAR   Specific Gravity, Urine 1.018  1.005 - 1.030   pH 6.5  5.0 - 8.0   Glucose, UA NEGATIVE  NEGATIVE mg/dL   Hgb urine dipstick LARGE (*) NEGATIVE   Bilirubin Urine LARGE (*) NEGATIVE   Ketones, ur 40 (*) NEGATIVE mg/dL   Protein, ur >300 (*) NEGATIVE mg/dL    Urobilinogen, UA 1.0  0.0 - 1.0 mg/dL   Nitrite POSITIVE (*) NEGATIVE   Leukocytes, UA LARGE (*) NEGATIVE  URINE MICROSCOPIC-ADD ON     Status: Abnormal   Collection Time    09/30/13  1:30 AM      Result Value Range   Squamous Epithelial / LPF FEW (*) RARE   WBC, UA 11-20  <3 WBC/hpf   RBC / HPF TOO NUMEROUS TO COUNT  <3 RBC/hpf   Bacteria, UA FEW (*) RARE   Sperm, UA PRESENT     Urine-Other URINALYSIS PERFORMED ON SUPERNATANT    COMPREHENSIVE METABOLIC PANEL     Status: Abnormal   Collection Time    09/30/13  1:33 AM      Result Value Range   Sodium 141  137 - 147 mEq/L   Potassium 4.1  3.7 - 5.3 mEq/L   Chloride 103  96 - 112 mEq/L   CO2 24  19 - 32 mEq/L   Glucose, Bld 124 (*) 70 - 99 mg/dL   BUN 10  6 - 23 mg/dL   Creatinine, Ser 0.85  0.50 - 1.35 mg/dL   Calcium 8.6  8.4 - 10.5 mg/dL   Total Protein 6.8  6.0 - 8.3 g/dL   Albumin 3.8  3.5 - 5.2 g/dL   AST 17  0 - 37 U/L   ALT 11  0 - 53 U/L   Alkaline Phosphatase 60  39 - 117 U/L   Total Bilirubin 0.4  0.3 - 1.2 mg/dL   GFR calc non Af Amer 81 (*) >90 mL/min   GFR calc Af Amer >90  >90 mL/min  CBC WITH DIFFERENTIAL     Status: Abnormal   Collection Time    09/30/13  1:33 AM      Result Value Range   WBC 5.2  4.0 - 10.5 K/uL   RBC 2.70 (*) 4.22 - 5.81 MIL/uL   Hemoglobin 8.1 (*) 13.0 - 17.0 g/dL   HCT 24.7 (*) 39.0 - 52.0 %   MCV 91.5  78.0 - 100.0 fL   MCH 30.0  26.0 - 34.0 pg   MCHC 32.8  30.0 - 36.0 g/dL   RDW 16.3 (*) 11.5 - 15.5 %   Platelets 55 (*) 150 - 400 K/uL   Neutrophils Relative % 66  43 - 77 %   Lymphocytes Relative 27  12 - 46 %   Monocytes Relative 7  3 - 12 %   Eosinophils Relative 0  0 - 5 %   Basophils Relative 0  0 - 1 %   Neutro Abs  3.4  1.7 - 7.7 K/uL   Lymphs Abs 1.4  0.7 - 4.0 K/uL   Monocytes Absolute 0.4  0.1 - 1.0 K/uL   Eosinophils Absolute 0.0  0.0 - 0.7 K/uL   Basophils Absolute 0.0  0.0 - 0.1 K/uL   RBC Morphology POLYCHROMASIA PRESENT     WBC Morphology MILD LEFT SHIFT (1-5%  METAS, OCC MYELO, OCC BANDS)    POCT I-STAT, CHEM 8     Status: Abnormal   Collection Time    09/30/13  1:38 AM      Result Value Range   Sodium 142  137 - 147 mEq/L   Potassium 4.0  3.7 - 5.3 mEq/L   Chloride 102  96 - 112 mEq/L   BUN 8  6 - 23 mg/dL   Creatinine, Ser 1.00  0.50 - 1.35 mg/dL   Glucose, Bld 119 (*) 70 - 99 mg/dL   Calcium, Ion 1.18  1.13 - 1.30 mmol/L   TCO2 25  0 - 100 mmol/L   Hemoglobin 8.8 (*) 13.0 - 17.0 g/dL   HCT 26.0 (*) 39.0 - 52.0 %  CBC     Status: Abnormal   Collection Time    09/30/13 10:50 AM      Result Value Range   WBC 7.9  4.0 - 10.5 K/uL   RBC 2.57 (*) 4.22 - 5.81 MIL/uL   Hemoglobin 7.9 (*) 13.0 - 17.0 g/dL   HCT 23.5 (*) 39.0 - 52.0 %   MCV 91.4  78.0 - 100.0 fL   MCH 30.7  26.0 - 34.0 pg   MCHC 33.6  30.0 - 36.0 g/dL   RDW 16.3 (*) 11.5 - 15.5 %   Platelets 45 (*) 150 - 400 K/uL  TYPE AND SCREEN     Status: None   Collection Time    09/30/13 10:50 AM      Result Value Range   ABO/RH(D) A POS     Antibody Screen NEG     Sample Expiration 10/03/2013    ABO/RH     Status: None   Collection Time    09/30/13 10:50 AM      Result Value Range   ABO/RH(D) A POS     No results found for this or any previous visit (from the past 240 hour(s)).  Renal Function:  Recent Labs  09/29/13 2035 09/30/13 0133 09/30/13 0138  CREATININE 0.94 0.85 1.00   Estimated Creatinine Clearance: 69.9 ml/min (by C-G formula based on Cr of 1).  Radiologic Imaging: Ct Cta Abd/pel W/cm &/or W/o Cm  09/30/2013   CLINICAL DATA:  Hematuria in urinary retention. Abdominal aortic aneurysm.  EXAM: CT ANGIOGRAPHY ABDOMEN AND PELVIS WITH CONTRAST AND WITHOUT CONTRAST  TECHNIQUE: Multidetector CT imaging of the abdomen and pelvis was performed using the standard protocol during bolus administration of intravenous contrast. Multiplanar reconstructed images including MIPs were obtained and reviewed to evaluate the vascular anatomy.  CONTRAST:  141mL OMNIPAQUE IOHEXOL  350 MG/ML SOLN  COMPARISON:  09/18/2013.  FINDINGS: Lung bases show dependent atelectasis. Liver is grossly within normal limits. Sludge layers within a Phrygian cap in the gallbladder. The spleen appears within normal limits. Adrenal glands are normal. Kidneys appear within normal limits aside from a left renal cysts. Ureters appear normal without hydronephrosis or hydroureter. Grossly, the stomach and small bowel appear normal. Pancreas and common bile duct are within normal limits.  Infrarenal abdominal aortic aneurysm is present. There is severe aortic and visceral atherosclerosis. 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 No occlusion are as evidence of mesenteric ischemia.  Infrarenal abdominal aortic aneurysm shows no evidence of rupture or leak. Aneurysm measures 62 mm x 57 mm at its largest. Previously this was reported at 16 mm and this is unchanged when reviewing the prior examination and measured at the same level as today's study. Extensive mural thrombus and plaque is present. The aorta assumes a more normal caliber at the bifurcation. Right common iliac artery ulcerated plaque is present. There is no dissection.  There is a right inguinal hernia that contains the appendix.  Urinary bladder demonstrates soft tissue density along the right lateral and posterior wall compatible with known bladder tumor. Prostatomegaly with prostate calcifications. No aggressive osseous lesions. Lumbar spondylosis. Bilateral hip osteoarthritis.  Review of the MIP images confirms the above findings.  IMPRESSION: 1. 62 mm x 57 mm infrarenal abdominal aortic aneurysm. No dissection or evidence of rupture. No acute vascular findings. 2. Bladder base tumor previously evaluated on CT urogram. 3. Right inguinal hernia containing the appendix.   Electronically Signed   By: Dereck Ligas M.D.   On: 09/30/2013 02:44     I reviewed the above imaging studies with Dr. Junious Silk  Impression/Recommendation Gross hematuria w/ clots: -  He has urge to void every few minutes and several large pieces of clots with dark urine color were noted in portable urinal.  External catheter was placed by nursing staff and drained 133ml cranberry urine color in about an hour, recently no clots.  Bladder scan post voide residual = 197 ml.  - Frequent urge to void is most likely related to bladder spasm caused by clots. However, since he is voiding w/ acceptable post void residue, comfortable, Hgb stable at 8.0, he does not need to have hematuria catheter placed for CBI today due to risk of worsening hematuria.  Plan:  -  To OR on Thursday for cysto TURBT and possible clot evacuation and fulguration. Urine culture is pending, will need to have negative urine culture prior to OR.  Continue to hold off all anticoags. -  Continue w/ IV Rocephin and external foley catheter for accurate I & O -  Will need preop med clearance from medicine team prior to procedure.  Will need to be NPO the night prior to OR procedure. -  Dr. Donalda Ewings will see patient today.  Family should be arriving this evening to see patient.  Lachrisha Ziebarth N 09/30/2013, 3:54 PM

## 2013-09-30 NOTE — ED Notes (Signed)
Rocephin 1 gm iv infused

## 2013-09-30 NOTE — ED Notes (Addendum)
C/o hematuria and urinary retention, reports some pain, sx intermittant since 07/31/13. Has seen urology. Recent CT scan shows surgical bladder tumor. Reports some pain. Wife present. Describes pt as fall risk, pt has alzheimers. Wife has disc of CT done at [facility].

## 2013-09-30 NOTE — Progress Notes (Signed)
Patient seen and examined this morning, agree with H&P.  - Hb stable this morning, continue to monitor - Urology, Dr. Junious Silk consulted for bladder mass - Vascular surgery Dr. Donnetta Hutching consulted for his AAA - appreciate vascular and urology coordination in regards to his care.  - patient with dementia, wife called and updated of plan this morning Mechele Claude, [phone]).  Kaysia Willard M. Cruzita Lederer, MD [facility] [phone]

## 2013-09-30 NOTE — Consult Note (Signed)
I have examined the patient, reviewed and agree with above. The patient had workup for painful hematuria and was found to have a 6.2 cm infrarenal abdominal aortic aneurysm. Also being seen by urology due to his ongoing hematuria. No family present currently. Patient with significant dementia and very little if any understanding of his disease process. Probably has approximately 10% per year risk of rupture. Would address bladder issue completely independently and would discuss further the indication if any for elective repair. Very difficult management situation due to the dementia.  Randle Shatzer, MD 09/30/2013 3:43 PM

## 2013-09-30 NOTE — Consult Note (Signed)
[facility] NOTE   MRN : [id number]  Reason for Consult: AAA Referring Physician: Caren Griffins, MD   History of Present Illness: This is a 80 y/o male with dementia.  He has hematuria and had a CT scan performed 2 weeks ago which incidentally showed a 6.2 cm X 5.7 cm infrarenal abdominal aortic aneurysm.  The patients family was not available and he was unable to give me his medical history.  The past medical history is Neoplasm of unspecified nature of bladder, dementia, unspecified, without behavioral disturbance.  He is not on any hypertensive, hypercholesterolemia, or DM medications.          Current Facility-Administered Medications  Medication Dose Route Frequency Provider Last Rate Last Dose  . 0.9 %  sodium chloride infusion   Intravenous Continuous Bynum Bellows, MD 100 mL/hr at 09/30/13 0657    . acetaminophen (TYLENOL) tablet 650 mg  650 mg Oral Q6H PRN Bynum Bellows, MD       Or  . acetaminophen (TYLENOL) suppository 650 mg  650 mg Rectal Q6H PRN Bynum Bellows, MD      . ALPRAZolam Duanne Moron) tablet 1 mg  1 mg Oral BID PRN Caren Griffins, MD   1 mg at 09/30/13 1029  . beta carotene w/minerals (OCUVITE) tablet 1 tablet  1 tablet Oral Daily Bynum Bellows, MD   1 tablet at 09/30/13 1029  . cefTRIAXone (ROCEPHIN) 1 g in dextrose 5 % 50 mL IVPB  1 g Intravenous Q24H Bynum Bellows, MD      . ondansetron (ZOFRAN) tablet 4 mg  4 mg Oral Q6H PRN Bynum Bellows, MD       Or  . ondansetron (ZOFRAN) injection 4 mg  4 mg Intravenous Q6H PRN Bynum Bellows, MD        Pt meds include: Statin :No Betablocker: No ASA: No Other anticoagulants/antiplatelets:   Past Medical History  Diagnosis Date  . AAA (abdominal aortic aneurysm)   . Dementia, unspecified, without behavioral disturbance   . Neoplasm of unspecified nature of bladder   . Urethral stricture unspecified     History reviewed. No pertinent past surgical  history.  Social History History  Substance Use Topics  . Smoking status: Current Every Day Smoker -- 1.00 packs/day  . Smokeless tobacco: Not on file  . Alcohol Use: No    Family History   No Known Allergies   REVIEW OF SYSTEMS  General: [ ]  Weight loss, [ ]  Fever, [ ]  chills Neurologic: [ ]  Dizziness, [ ]  Blackouts, [ ]  Seizure [ ]  Stroke, [ ]  "Mini stroke", [ ]  Slurred speech, [ ]  Temporary blindness; [ ]  weakness in arms or legs, [ ]  Hoarseness [ ]  Dysphagia Cardiac: [ ]  Chest pain/pressure, [ ]  Shortness of breath at rest [ ]  Shortness of breath with exertion, [ ]  Atrial fibrillation or irregular heartbeat  Vascular: [ ]  Pain in legs with walking, [ ]  Pain in legs at rest, [ ]  Pain in legs at night,  [ ]  Non-healing ulcer, [ ]  Blood clot in vein/DVT,   Pulmonary: [ ]  Home oxygen, [ ]  Productive cough, [ ]  Coughing up blood, [ ]  Asthma,  [ ]  Wheezing [ ]  COPD Musculoskeletal:  [ ]  Arthritis, [ ]  Low back pain, [ ]  Joint pain Hematologic: [ ]  Easy Bruising, [x ] Anemia; [ ]  Hepatitis Gastrointestinal: [ ]  Blood in stool, [ ]  Gastroesophageal Reflux/heartburn, Urinary: [ ]   chronic Kidney disease, [ ]  on HD - [ ]  MWF or [ ]  TTHS, [ ]  Burning with urination, [x ] Difficulty urinating Skin: [ ]  Rashes, [ ]  Wounds Psychological: [ ]  Anxiety, [ ]  Depression [x] demetia  Physical Examination Filed Vitals:   09/30/13 0554 09/30/13 0603 09/30/13 0641 09/30/13 1000  BP: 168/78  177/81 168/80  Pulse: 67  75 77  Temp: 98 F (36.7 C) 98 F (36.7 C) 97.7 F (36.5 C) 97.5 F (36.4 C)  TempSrc: Oral Oral Oral Oral  Resp: 20  19 20   Height:   6\' 3"  (1.905 m)   Weight:   181 lb 14.1 oz (82.5 kg)   SpO2: 97% 99% 96% 98%   Body mass index is 22.73 kg/(m^2).  General:  WDWN in NAD Gait: Normal HENT: WNL Eyes: Pupils equal Pulmonary: normal non-labored breathing  Cardiac: RRR Abdomen: soft, NT, visible pulsatile mass central abdomin Skin: no rashes, ulcers noted;  no Gangrene ,  no cellulitis; no open wounds;   Vascular Exam/Pulses:femoral, popliteal palpable pulses doppler biphasic DP /PT equal bilateral     Musculoskeletal: no muscle wasting or atrophy; no edema  Neurologic: Alert and follows prompts with verbal  tactile direction; Appropriate Affect ;  SENSATION: normal; MOTOR FUNCTION: 5/5 Symmetric Speech is fluent/normal   Significant Diagnostic Studies: CBC Lab Results  Component Value Date   WBC 7.9 09/30/2013   HGB 7.9* 09/30/2013   HCT 23.5* 09/30/2013   MCV 91.4 09/30/2013   PLT 45* 09/30/2013    BMET    Component Value Date/Time   NA 142 09/30/2013 0138   K 4.0 09/30/2013 0138   CL 102 09/30/2013 0138   CO2 24 09/30/2013 0133   GLUCOSE 119* 09/30/2013 0138   BUN 8 09/30/2013 0138   CREATININE 1.00 09/30/2013 0138   CALCIUM 8.6 09/30/2013 0133   GFRNONAA 81* 09/30/2013 0133   GFRAA >90 09/30/2013 0133   Estimated Creatinine Clearance: 69.9 ml/min (by C-G formula based on Cr of 1).  COAG No results found for this basename: INR, PROTIME       ASSESSMENT/PLAN:  Asymptomatic abdominal aortic aneurysm Wife would like to have Dr. Donnetta Hutching perform any interventions if needed.     Laurence Slate [facility] 09/30/2013 12:27 PM

## 2013-09-30 NOTE — Consult Note (Addendum)
Patient was seen and examined. I agree with NP Alfonse Spruce A/P. I discussed patient with NP Alfonse Spruce. I reviewed Dr. Luther Parody note. Family was not here and I called number on chart x 3 but got a busy signal. I'll set pt up for TURBT hopefully Wednesday, possible Thurs depending on OR (I already tried to add another TURBT on for tomorrow and there was no OR time during the day). H/H is down but is stable. His abd is soft and NT, ND on my exam.    His current Hgb is down to 7.5. He will need resuscitation medical clearance prior to OR. Significant risk for bleeding exists for TURBT. I'll use the bipolar in hopes of improving hemostasis.   I tried home number again x 2. Busy. Another consideration is family goals of care, patient's overall prognosis given his dementia (apart from bladder mass and AAA).

## 2013-10-01 ENCOUNTER — Inpatient Hospital Stay (HOSPITAL_COMMUNITY): Payer: Medicare Other

## 2013-10-01 ENCOUNTER — Telehealth: Payer: Self-pay | Admitting: Vascular Surgery

## 2013-10-01 ENCOUNTER — Other Ambulatory Visit: Payer: Self-pay | Admitting: Urology

## 2013-10-01 DIAGNOSIS — N390 Urinary tract infection, site not specified: Secondary | ICD-10-CM

## 2013-10-01 DIAGNOSIS — N3289 Other specified disorders of bladder: Secondary | ICD-10-CM

## 2013-10-01 LAB — URINE CULTURE
COLONY COUNT: NO GROWTH
CULTURE: NO GROWTH

## 2013-10-01 LAB — BASIC METABOLIC PANEL
BUN: 7 mg/dL (ref 6–23)
CHLORIDE: 105 meq/L (ref 96–112)
CO2: 24 meq/L (ref 19–32)
CREATININE: 0.85 mg/dL (ref 0.50–1.35)
Calcium: 8.6 mg/dL (ref 8.4–10.5)
GFR calc non Af Amer: 81 mL/min — ABNORMAL LOW (ref >90)
Glucose, Bld: 128 mg/dL — ABNORMAL HIGH (ref 70–99)
POTASSIUM: 3.9 meq/L (ref 3.7–5.3)
Sodium: 144 mEq/L (ref 137–147)

## 2013-10-01 LAB — CBC
HEMATOCRIT: 23.9 % — AB (ref 39.0–52.0)
Hemoglobin: 7.7 g/dL — ABNORMAL LOW (ref 13.0–17.0)
MCH: 29.6 pg (ref 26.0–34.0)
MCHC: 32.2 g/dL (ref 30.0–36.0)
MCV: 91.9 fL (ref 78.0–100.0)
Platelets: 54 10*3/uL — ABNORMAL LOW (ref 150–400)
RBC: 2.6 MIL/uL — AB (ref 4.22–5.81)
RDW: 16.7 % — AB (ref 11.5–15.5)
WBC: 7.4 10*3/uL (ref 4.0–10.5)

## 2013-10-01 LAB — PREPARE RBC (CROSSMATCH)

## 2013-10-01 MED ORDER — MITOMYCIN CHEMO FOR BLADDER INSTILLATION 40 MG
40.0000 mg | Freq: Once | INTRAVENOUS | Status: DC
  Filled 2013-10-01: qty 40

## 2013-10-01 MED ORDER — CIPROFLOXACIN IN D5W 400 MG/200ML IV SOLN
400.0000 mg | INTRAVENOUS | Status: AC
Start: 2013-10-02 — End: 2013-10-02
  Administered 2013-10-02: 400 mg via INTRAVENOUS
  Filled 2013-10-01: qty 200

## 2013-10-01 MED ORDER — TAMSULOSIN HCL 0.4 MG PO CAPS
0.4000 mg | ORAL_CAPSULE | Freq: Every day | ORAL | Status: DC
  Administered 2013-10-01: 0.4 mg via ORAL
  Filled 2013-10-01 (×3): qty 1

## 2013-10-01 MED ORDER — AMLODIPINE BESYLATE 2.5 MG PO TABS
2.5000 mg | ORAL_TABLET | Freq: Every day | ORAL | Status: DC
  Administered 2013-10-01 – 2013-10-03 (×3): 2.5 mg via ORAL
  Filled 2013-10-01 (×3): qty 1

## 2013-10-01 NOTE — Progress Notes (Signed)
[facility] PROGRESS NOTE  Shawn [name] JGG:[id number] DOB: [birth year] DOA: 09/30/2013 PCP: Glenna Fellows, MD  Assessment/Plan: 80 y/o male smoker with PMH of dementia, HTN, TIA,  recent diagnoses of abdominal aortic aneurysm, and bladder mass presented with hematuria   1. Acute blood loss anemia due to hematuria;  -TF two units PRBC 2/10; TF platelets 2/10; monitor hg, TF prn; awaiting TURP  2. Thrombocytopenia likely consumptive with hematuria; Tf platelets 2/10; TF prn;   3. Advanced dementia not on meds, takes xanax for anxiety only   4. HTN not at meds at home; started low dose amlodipine;   5. Bladder mass/tumor with hematuria; CT: Urinary bladder demonstrates soft tissue density along the right  lateral and posterior wall compatible with known bladder tumor -per urology TURBT hopefully Wednesday,or Thurs  -probable UTI, on atx;   6. AAA: 6.2 cm infrarenal abdominal aortic aneurysm; defer management to vascular surgery,. appreciate the input   preop eval: patient has unclear medical history, he does not take meds at home; per his wife h/o TIA -RCRI score is low, but may have high risk surgery with AAA;  -obtain preop ECG, echo, CXR (smoker), TF PRBC, platelets prn; optimize preop  -d/w family wife, who understands, and accepts the risk , complications   Not on heparin for DVT prophylaxis due to acute bleeding; cont SCD   Code Status: Full Family Communication: d/w patient, updated Mechele Claude, [phone]) (indicate person spoken with, relationship, and if by phone, the number) Disposition Plan: pend clinical improvement    Consultants:  Vascular surgery  Urology   Procedures:  Pend turb   Antibiotics:  None  (indicate start date, and stop date if known)  HPI/Subjective: Alert, confused  Objective: Filed Vitals:   10/01/13 0510  BP: 173/94  Pulse: 63  Temp: 97.4 F (36.3 C)  Resp: 20    Intake/Output Summary (Last 24 hours) at 10/01/13 0906 Last data  filed at 10/01/13 4656  Gross per 24 hour  Intake   3058 ml  Output    700 ml  Net   2358 ml   Filed Weights   09/30/13 0641 10/01/13 0606  Weight: 82.5 kg (181 lb 14.1 oz) 83.9 kg (184 lb 15.5 oz)    Exam:   General:  alert  Cardiovascular: s1,s2 rrr  Respiratory:  Poor ventilation in LL  Abdomen: soft, nt, nd   Musculoskeletal: no edema   Data Reviewed: Basic Metabolic Panel:  Recent Labs Lab 09/29/13 2035 09/30/13 0133 09/30/13 0138 10/01/13 0340  NA 143 141 142 144  K 4.4 4.1 4.0 3.9  CL 104 103 102 105  CO2 26 24  --  24  GLUCOSE 123* 124* 119* 128*  BUN 11 10 8 7   CREATININE 0.94 0.85 1.00 0.85  CALCIUM 9.1 8.6  --  8.6   Liver Function Tests:  Recent Labs Lab 09/29/13 2035 09/30/13 0133  AST 22 17  ALT 14 11  ALKPHOS 66 60  BILITOT 0.4 0.4  PROT 7.2 6.8  ALBUMIN 4.0 3.8   No results found for this basename: LIPASE, AMYLASE,  in the last 168 hours No results found for this basename: AMMONIA,  in the last 168 hours CBC:  Recent Labs Lab 09/29/13 2035 09/30/13 0133 09/30/13 0138 09/30/13 1050 09/30/13 1510 09/30/13 2240 10/01/13 0340  WBC 6.3 5.2  --  7.9 6.9 5.4 7.4  NEUTROABS 4.2 3.4  --   --   --   --   --  HGB 8.0* 8.1* 8.8* 7.9* 7.5* 7.1* 7.7*  HCT 24.5* 24.7* 26.0* 23.5* 22.5* 21.8* 23.9*  MCV 91.1 91.5  --  91.4 91.5 91.2 91.9  PLT 59* 55*  --  45* 41* 46* 54*   Cardiac Enzymes: No results found for this basename: CKTOTAL, CKMB, CKMBINDEX, TROPONINI,  in the last 168 hours BNP (last 3 results) No results found for this basename: PROBNP,  in the last 8760 hours CBG: No results found for this basename: GLUCAP,  in the last 168 hours  Recent Results (from the past 240 hour(s))  URINE CULTURE     Status: None   Collection Time    09/30/13  1:30 AM      Result Value Range Status   Specimen Description URINE, CLEAN CATCH   Final   Special Requests NONE   Final   Culture  Setup Time     Final   Value: 09/30/2013 08:40      Performed at [facility]     Final   Value: NO GROWTH     Performed at [facility]   Culture     Final   Value: NO GROWTH     Performed at [facility]   Report Status 10/01/2013 FINAL   Final     Studies: Ct Cta Abd/pel W/cm &/or W/o Cm  09/30/2013   CLINICAL DATA:  Hematuria in urinary retention. Abdominal aortic aneurysm.  EXAM: CT ANGIOGRAPHY ABDOMEN AND PELVIS WITH CONTRAST AND WITHOUT CONTRAST  TECHNIQUE: Multidetector CT imaging of the abdomen and pelvis was performed using the standard protocol during bolus administration of intravenous contrast. Multiplanar reconstructed images including MIPs were obtained and reviewed to evaluate the vascular anatomy.  CONTRAST:  160mL OMNIPAQUE IOHEXOL 350 MG/ML SOLN  COMPARISON:  09/18/2013.  FINDINGS: Lung bases show dependent atelectasis. Liver is grossly within normal limits. Sludge layers within a Phrygian cap in the gallbladder. The spleen appears within normal limits. Adrenal glands are normal. Kidneys appear within normal limits aside from a left renal cysts. Ureters appear normal without hydronephrosis or hydroureter. Grossly, the stomach and small bowel appear normal. Pancreas and common bile duct are within normal limits.  Infrarenal abdominal aortic aneurysm is present. There is severe aortic and visceral atherosclerosis. 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 No occlusion are as evidence of mesenteric ischemia.  Infrarenal abdominal aortic aneurysm shows no evidence of rupture or leak. Aneurysm measures 62 mm x 57 mm at its largest. Previously this was reported at 16 mm and this is unchanged when reviewing the prior examination and measured at the same level as today's study. Extensive mural thrombus and plaque is present. The aorta assumes a more normal caliber at the bifurcation. Right common  iliac artery ulcerated plaque is present. There is no dissection.  There is a right inguinal hernia that contains the appendix.  Urinary bladder demonstrates soft tissue density along the right lateral and posterior wall compatible with known bladder tumor. Prostatomegaly with prostate calcifications. No aggressive osseous lesions. Lumbar spondylosis. Bilateral hip osteoarthritis.  Review of the MIP images confirms the above findings.  IMPRESSION: 1. 62 mm x 57 mm infrarenal abdominal aortic aneurysm. No dissection or evidence of rupture. No acute vascular findings. 2. Bladder base tumor previously evaluated on CT urogram. 3. Right inguinal hernia containing the appendix.   Electronically Signed   By: Cay Schillings  Lamke M.D.   On: 09/30/2013 02:44    Scheduled Meds: . beta carotene w/minerals  1 tablet Oral Daily  . cefTRIAXone (ROCEPHIN)  IV  1 g Intravenous Q24H  . influenza vac split quadrivalent PF  0.5 mL Intramuscular Tomorrow-1000  . pneumococcal 23 valent vaccine  0.5 mL Intramuscular Tomorrow-1000   Continuous Infusions:   Principal Problem:   Hematuria Active Problems:   Acute blood loss anemia   UTI (lower urinary tract infection)   Bladder mass   AAA (abdominal aortic aneurysm)   Alzheimer's dementia    Time spent: >35 minutes     Kinnie Feil  [facility] Pager [phone]. If 7PM-7AM, please contact night-coverage at [email or web address], password [facility] 10/01/2013, 9:06 AM  LOS: 1 day

## 2013-10-01 NOTE — Progress Notes (Signed)
0130 Patient taken to bathroom and voided mod amount of bloody urine with large amount of blood clots noted. 0230 Patient taken to bathroom and try to get patient to use the urinal patient was unable to void. 0300 Patient very restless states he needs to go to the bathroom. Patient assisted to bathroom and voided small amount of bloody urine. Patient c/o pain in lower abdomen. Patient was scan for 74ml. Forrest Moron NP paged. 6415 Patient voided incontinent large amount of blood tinge urine. 0330 Patient c/o of abd pain and voided incontinent moderate of blood tinge urine. 0344 Tylenol given po for abd discomfort. 0400 Patient states he is feeling better. Will continue to monitor.

## 2013-10-01 NOTE — Progress Notes (Signed)
Patient ID: Shawn [name], male   DOB: 11-12-1931, 80 y.o.   MRN: [id number]   Pt H/h stable, but he is getting blood which is appropriate for preparation for OR. I note he is uncomfortable and has increasing PVR.   PE: NAD - says he is voiding fine but aid says he is up and down trying to void.  Abd - mild distended. I don't palpate his bladder.   Procedure: he was placed supine, prepped and draped. A 20Fr coude was placed and drained 600 ml light red urine. Irrigated to clear quickly. Equal return. No apparent ongoing bleeding.   Imp/plan - Gross hematuria Bladder mass - plan TURBT +/- instillation mitomycin C tomorrow at 3:30 PM [facility].  BPH Urinary retention - not a lot of clots, might be from supine position, BPH, etc. Will start tamsulosin.

## 2013-10-01 NOTE — Telephone Encounter (Signed)
Message copied by Gena Fray on Tue Oct 01, 2013 12:36 PM ------      Message from: Merleen Nicely      Created: Tue Oct 01, 2013  9:47 AM      Regarding: CANCELLATION       We need to cancel his appointment. He is in the hospital.            Thanks       Ebony Hail  ------

## 2013-10-01 NOTE — Progress Notes (Signed)
EKG completed.  Will continue to monitor. Shawn [name]

## 2013-10-02 ENCOUNTER — Encounter (HOSPITAL_COMMUNITY): Payer: Self-pay

## 2013-10-02 ENCOUNTER — Encounter (HOSPITAL_COMMUNITY): Payer: Self-pay | Admitting: Registered Nurse

## 2013-10-02 ENCOUNTER — Inpatient Hospital Stay (HOSPITAL_COMMUNITY): Payer: Medicare Other | Admitting: Registered Nurse

## 2013-10-02 ENCOUNTER — Other Ambulatory Visit: Payer: Self-pay | Admitting: Urology

## 2013-10-02 ENCOUNTER — Encounter (HOSPITAL_COMMUNITY)
Admission: EM | Disposition: A | Discharge status: Home / Self Care | Payer: Self-pay | Source: Home / Self Care | Attending: Internal Medicine

## 2013-10-02 ENCOUNTER — Encounter (HOSPITAL_COMMUNITY): Payer: Medicare Other | Admitting: Registered Nurse

## 2013-10-02 DIAGNOSIS — I369 Nonrheumatic tricuspid valve disorder, unspecified: Secondary | ICD-10-CM

## 2013-10-02 HISTORY — PX: TRANSURETHRAL RESECTION OF BLADDER TUMOR WITH GYRUS (TURBT-GYRUS): SHX6458

## 2013-10-02 LAB — TYPE AND SCREEN
ABO/RH(D): A POS
Antibody Screen: NEGATIVE
UNIT DIVISION: 0
Unit division: 0

## 2013-10-02 LAB — BASIC METABOLIC PANEL
BUN: 8 mg/dL (ref 6–23)
CALCIUM: 8.3 mg/dL — AB (ref 8.4–10.5)
CO2: 23 mEq/L (ref 19–32)
Chloride: 101 mEq/L (ref 96–112)
Creatinine, Ser: 0.87 mg/dL (ref 0.50–1.35)
GFR calc Af Amer: >90 mL/min (ref >90)
GFR, EST NON AFRICAN AMERICAN: 80 mL/min — AB (ref >90)
GLUCOSE: 135 mg/dL — AB (ref 70–99)
Potassium: 3.8 mEq/L (ref 3.7–5.3)
Sodium: 137 mEq/L (ref 137–147)

## 2013-10-02 LAB — HEMOGLOBIN AND HEMATOCRIT, BLOOD
HCT: 26.8 % — ABNORMAL LOW (ref 39.0–52.0)
HEMOGLOBIN: 8.9 g/dL — AB (ref 13.0–17.0)

## 2013-10-02 LAB — CBC
HEMATOCRIT: 26 % — AB (ref 39.0–52.0)
HEMOGLOBIN: 8.8 g/dL — AB (ref 13.0–17.0)
MCH: 30 pg (ref 26.0–34.0)
MCHC: 33.8 g/dL (ref 30.0–36.0)
MCV: 88.7 fL (ref 78.0–100.0)
Platelets: 66 10*3/uL — ABNORMAL LOW (ref 150–400)
RBC: 2.93 MIL/uL — ABNORMAL LOW (ref 4.22–5.81)
RDW: 16.5 % — ABNORMAL HIGH (ref 11.5–15.5)
WBC: 9.4 10*3/uL (ref 4.0–10.5)

## 2013-10-02 LAB — PREPARE PLATELET PHERESIS: UNIT DIVISION: 0

## 2013-10-02 LAB — MRSA PCR SCREENING: MRSA by PCR: NEGATIVE

## 2013-10-02 LAB — PREPARE RBC (CROSSMATCH)

## 2013-10-02 LAB — ABO/RH: ABO/RH(D): A POS

## 2013-10-02 SURGERY — TRANSURETHRAL RESECTION OF BLADDER TUMOR WITH GYRUS (TURBT-GYRUS)
Anesthesia: General

## 2013-10-02 MED ORDER — CEFAZOLIN SODIUM-DEXTROSE 2-3 GM-% IV SOLR
INTRAVENOUS | Status: AC
  Filled 2013-10-02: qty 50

## 2013-10-02 MED ORDER — BELLADONNA ALKALOIDS-OPIUM 16.2-60 MG RE SUPP
RECTAL | Status: DC | PRN
  Administered 2013-10-02: 1 via RECTAL

## 2013-10-02 MED ORDER — ONDANSETRON HCL 4 MG/2ML IJ SOLN
INTRAMUSCULAR | Status: AC
  Filled 2013-10-02: qty 2

## 2013-10-02 MED ORDER — CEFAZOLIN SODIUM-DEXTROSE 2-3 GM-% IV SOLR
INTRAVENOUS | Status: DC | PRN
Start: 2013-10-02 — End: 2013-10-02
  Administered 2013-10-02: 2 g via INTRAVENOUS

## 2013-10-02 MED ORDER — LIDOCAINE HCL 2 % EX GEL
CUTANEOUS | Status: DC | PRN
  Administered 2013-10-02: 1 via URETHRAL

## 2013-10-02 MED ORDER — PROPOFOL 10 MG/ML IV BOLUS
INTRAVENOUS | Status: DC | PRN
  Administered 2013-10-02: 40 mg via INTRAVENOUS
  Administered 2013-10-02: 20 mg via INTRAVENOUS
  Administered 2013-10-02: 140 mg via INTRAVENOUS

## 2013-10-02 MED ORDER — FENTANYL CITRATE 0.05 MG/ML IJ SOLN
INTRAMUSCULAR | Status: AC
  Filled 2013-10-02: qty 2

## 2013-10-02 MED ORDER — FENTANYL CITRATE 0.05 MG/ML IJ SOLN
INTRAMUSCULAR | Status: DC | PRN
  Administered 2013-10-02 (×2): 50 ug via INTRAVENOUS

## 2013-10-02 MED ORDER — HYOSCYAMINE SULFATE 0.125 MG SL SUBL
0.1250 mg | SUBLINGUAL_TABLET | SUBLINGUAL | Status: DC | PRN
  Filled 2013-10-02: qty 1

## 2013-10-02 MED ORDER — MIDAZOLAM HCL 2 MG/2ML IJ SOLN
INTRAMUSCULAR | Status: DC | PRN
  Administered 2013-10-02 (×2): 1 mg via INTRAVENOUS

## 2013-10-02 MED ORDER — LACTATED RINGERS IV SOLN
INTRAVENOUS | Status: DC
  Administered 2013-10-02: 17:00:00 via INTRAVENOUS
  Administered 2013-10-02: 1000 mL via INTRAVENOUS

## 2013-10-02 MED ORDER — FENTANYL CITRATE 0.05 MG/ML IJ SOLN
25.0000 ug | INTRAMUSCULAR | Status: DC | PRN
  Administered 2013-10-02: 25 ug via INTRAVENOUS
  Administered 2013-10-02: 50 ug via INTRAVENOUS

## 2013-10-02 MED ORDER — LIDOCAINE HCL 2 % EX GEL
CUTANEOUS | Status: AC
  Filled 2013-10-02: qty 10

## 2013-10-02 MED ORDER — LACTATED RINGERS IV SOLN
INTRAVENOUS | Status: DC | PRN
  Administered 2013-10-02 (×2): via INTRAVENOUS

## 2013-10-02 MED ORDER — LIDOCAINE HCL (CARDIAC) 20 MG/ML IV SOLN
INTRAVENOUS | Status: DC | PRN
  Administered 2013-10-02: 50 mg via INTRAVENOUS

## 2013-10-02 MED ORDER — ONDANSETRON HCL 4 MG/2ML IJ SOLN
INTRAMUSCULAR | Status: DC | PRN
  Administered 2013-10-02: 4 mg via INTRAVENOUS

## 2013-10-02 MED ORDER — HALOPERIDOL LACTATE 5 MG/ML IJ SOLN
5.0000 mg | Freq: Once | INTRAMUSCULAR | Status: AC
  Administered 2013-10-02: 5 mg via INTRAVENOUS
  Filled 2013-10-02: qty 1

## 2013-10-02 MED ORDER — MEPERIDINE HCL 50 MG/ML IJ SOLN: 6.2500 mg | INTRAMUSCULAR | Status: DC | PRN

## 2013-10-02 MED ORDER — CIPROFLOXACIN IN D5W 400 MG/200ML IV SOLN
INTRAVENOUS | Status: AC
  Filled 2013-10-02: qty 200

## 2013-10-02 MED ORDER — PROMETHAZINE HCL 25 MG/ML IJ SOLN: 6.2500 mg | INTRAMUSCULAR | Status: DC | PRN

## 2013-10-02 MED ORDER — SODIUM CHLORIDE 0.9 % IR SOLN
Status: DC | PRN
  Administered 2013-10-02: 12000 mL

## 2013-10-02 MED ORDER — PROPOFOL 10 MG/ML IV BOLUS
INTRAVENOUS | Status: AC
  Filled 2013-10-02: qty 20

## 2013-10-02 MED ORDER — LIDOCAINE HCL (CARDIAC) 20 MG/ML IV SOLN
INTRAVENOUS | Status: AC
  Filled 2013-10-02: qty 5

## 2013-10-02 MED ORDER — BELLADONNA ALKALOIDS-OPIUM 16.2-60 MG RE SUPP
RECTAL | Status: AC
  Filled 2013-10-02: qty 1

## 2013-10-02 MED ORDER — MIDAZOLAM HCL 2 MG/2ML IJ SOLN
INTRAMUSCULAR | Status: AC
  Filled 2013-10-02: qty 2

## 2013-10-02 SURGICAL SUPPLY — 22 items
BAG URINE DRAINAGE (UROLOGICAL SUPPLIES) ×3 IMPLANT
BAG URO CATCHER STRL LF (DRAPE) ×3 IMPLANT
CATH 18FR 3 WAY 30 (CATHETERS) ×2
CATH 18FR 3WAY 30CC (CATHETERS) ×1 IMPLANT
CATH FOLEY 3WAY 30CC 22FR (CATHETERS) ×3 IMPLANT
DRAPE CAMERA CLOSED 9X96 (DRAPES) ×3 IMPLANT
ELECT BUTTON HF 24-28F 2 30DE (ELECTRODE) ×3 IMPLANT
ELECT LOOP MED HF 24F 12D (CUTTING LOOP) IMPLANT
ELECT LOOP MED HF 24F 12D CBL (CLIP) ×3 IMPLANT
ELECT RESECT VAPORIZE 12D CBL (ELECTRODE) IMPLANT
EVACUATOR MICROVAS BLADDER (UROLOGICAL SUPPLIES) ×3 IMPLANT
GLOVE BIOGEL M STRL SZ7.5 (GLOVE) ×3 IMPLANT
GOWN STRL REUS W/TWL XL LVL3 (GOWN DISPOSABLE) ×3 IMPLANT
HOLDER FOLEY CATH W/STRAP (MISCELLANEOUS) IMPLANT
IV NS IRRIG 3000ML ARTHROMATIC (IV SOLUTION) ×12 IMPLANT
MANIFOLD NEPTUNE II (INSTRUMENTS) ×3 IMPLANT
PACK CYSTO (CUSTOM PROCEDURE TRAY) ×3 IMPLANT
PLUG CATH AND CAP STER (CATHETERS) ×3 IMPLANT
SYR 30ML LL (SYRINGE) IMPLANT
SYRINGE IRR TOOMEY STRL 70CC (SYRINGE) ×3 IMPLANT
TUBING CONNECTING 10 (TUBING) ×2 IMPLANT
TUBING CONNECTING 10' (TUBING) ×1

## 2013-10-02 NOTE — Progress Notes (Signed)
Pt awakened and had urge to void/attemptingto get get OOB.  Unable to reorient pt, rewuiring 3 persons to keep pt from getting oob  Or pulling on catheter.  Dr Marcell Barlow at bedside and orders received. Pt quiet and comfortable after 167mcg fentanyl, 1 mg versed and 5 mg Haldol (all IV).

## 2013-10-02 NOTE — Transfer of Care (Signed)
Immediate Anesthesia Transfer of Care Note  Patient: Shawn [name]  Procedure(s) Performed: Procedure(s): TRANSURETHRAL RESECTION OF BLADDER TUMOR  greater than 5CM WITH GYRUS (TURBT-GYRUS) (N/A)  Patient Location: PACU  Anesthesia Type:General  Level of Consciousness: awake, pateint uncooperative, confused and responds to stimulation  Airway & Oxygen Therapy: Patient Spontanous Breathing and Patient connected to face mask oxygen  Post-op Assessment: Report given to PACU RN, Post -op Vital signs reviewed and stable and Patient moving all extremities  Post vital signs: Reviewed and stable  Complications: No apparent anesthesia complications

## 2013-10-02 NOTE — Anesthesia Preprocedure Evaluation (Addendum)
Anesthesia Evaluation  Patient identified by MRN, date of birth, ID band Patient awake    Reviewed: Allergy & Precautions, H&P , NPO status , Patient's Chart, lab work & pertinent test results  History of Anesthesia Complications (+) AWARENESS UNDER ANESTHESIA  Airway Mallampati: II TM Distance: >3 FB Neck ROM: Full    Dental no notable dental hx.    Pulmonary Current Smoker,  breath sounds clear to auscultation  Pulmonary exam normal       Cardiovascular + Peripheral Vascular Disease (AAA  62 mm x 57 mm) Rhythm:Regular Rate:Normal     Neuro/Psych dementia TIAnegative neurological ROS  negative psych ROS   GI/Hepatic negative GI ROS, Neg liver ROS,   Endo/Other  negative endocrine ROS  Renal/GU negative Renal ROS  negative genitourinary   Musculoskeletal negative musculoskeletal ROS (+)   Abdominal   Peds negative pediatric ROS (+)  Hematology  (+) Blood dyscrasia, , Thrombocytopenia. plt 66k   Anesthesia Other Findings   Reproductive/Obstetrics negative OB ROS                         Anesthesia Physical Anesthesia Plan  ASA: IV  Anesthesia Plan: General   Post-op Pain Management:    Induction: Intravenous  Airway Management Planned: LMA  Additional Equipment:   Intra-op Plan:   Post-operative Plan: Extubation in OR  Informed Consent: I have reviewed the patients History and Physical, chart, labs and discussed the procedure including the risks, benefits and alternatives for the proposed anesthesia with the patient or authorized representative who has indicated his/her understanding and acceptance.   Dental advisory given  Plan Discussed with: CRNA  Anesthesia Plan Comments: (Discussed risk of AAA rupture with likely fatality with family. They agree, understand and wish to proceed.)        Anesthesia Quick Evaluation

## 2013-10-02 NOTE — Preoperative (Addendum)
Beta Blockers   Reason not to administer Beta Blockers:Not Applicable 

## 2013-10-02 NOTE — Anesthesia Postprocedure Evaluation (Signed)
  Anesthesia Post-op Note  Patient: Shawn [name]  Procedure(s) Performed: Procedure(s) (LRB): TRANSURETHRAL RESECTION OF BLADDER TUMOR  greater than 5CM WITH GYRUS (TURBT-GYRUS) (N/A)  Patient Location: PACU  Anesthesia Type: General  Level of Consciousness: awake and disoriented  Airway and Oxygen Therapy: Patient Spontanous Breathing  Post-op Pain: mild  Post-op Assessment: Post-op Vital signs reviewed, Patient's Cardiovascular Status Stable, Respiratory Function Stable, Patent Airway and No signs of Nausea or vomiting  Last Vitals:  Filed Vitals:   10/02/13 1844  BP: 165/74  Pulse: 84  Temp:   Resp: 12    Post-op Vital Signs: stable   Complications: post-op delirium and agitation. Treated with versed and haldol with improvement

## 2013-10-02 NOTE — Progress Notes (Signed)
Patient ID: Shawn [name], male   DOB: [birth year], 80 y.o.   MRN: [id number]   H/H up to 8.8, but per reports he continues to have gross hematuria.   I called patient's wife this AM and discussed CT findings (poss bladder tumor but also BPH) and the nature, R/B/A to cystoscopy possible TURBT, poss TURP, poss mitomycin-C instillation. Discussed risks such as bleeding, bladder perforation among others. Discussed CV risks and patient's typically done outpatient or go to floor after these endoscopic procedures but Shawn [name] could need ICU care.   Plan as above.  Could he be transferred to [facility] for post-op care? I'll leave it up to hospitalists to discuss but GU floor at [facility] comfortable with [facility]. Post-op GU care. Etc.

## 2013-10-02 NOTE — Op Note (Signed)
Preoperative diagnosis: Gross hematuria, clot urinary retention, BPH, bladder tumor Postop diagnosis: Bladder tumor  Procedure: Exam under anesthesia TURBT greater than 5 cm  Surgeon: Junious Silk  Anesthesia: Gen.  Findings: On exam under anesthesia the penis was normal without mass or lesion. Testicles were descended bilaterally and palpably normal. On digital rectal exam the prostate was smooth without heart area or nodule and I did not palpate any bladder or suprapubic masses on bimanual exam.  Cystoscopy revealed a normal urethra, short and nonobstructive prostatic urethra, trigone and ureteral orifices in their normal orthotopic position with good efflux, clear. The bladder contained about a 6 cm mass along the right lateral wall that was broad-based and appeared to be deep into the submucosa or muscle. The remainder of the mucosa appeared normal. There was mild trabeculation of the bladder. It was not an appropriate tumor for mitomycin. There was an excellent resection with excellent hemostasis.  There was an odd cystic-appearing lesion at the right bladder neck which was photographed and resected separately.   Description of procedure: After consent was obtained patient brought to the operating room. After adequate anesthesia he was placed in lithotomy position and prepped and draped in the usual sterile fashion. An exam under anesthesia was performed, I placed a B&O suppository. The resectoscope was passed per urethra but the meatus was quite tight. Meatus dilated to 34 French then the resectoscope passed without difficulty. The bladder was examined in its entirety. Resected the right bladder tumor and sent the chips as bladder tumor chips. I then resected the tumor again a bit deeper and sent this as bladder tumor base. The area was fulgurated and there was excellent hemostasis under low pressure. I then resected the cystic structure the right bladder neck and this drained some  mucinous-appearing fluid. The area was fulgurated and there was excellent hemostasis. The bladder was filled and the scope removed. An 78 French three-way Foley was placed at the urine was clear and I left it to gravity drainage. The patient was awakened taken to recovery room in stable condition.   Complications: None  Blood loss: Minimal   Specimens:  #1 bladder tumor chips  #2 bladder tumor base  #3 right bladder neck lesion   Drains: 18 French three-way Foley catheter to gravity drainage   Disposition: Patient stable to PACU

## 2013-10-02 NOTE — Progress Notes (Signed)
  Echocardiogram 2D Echocardiogram has been performed.  Shawn [name] FRANCES 10/02/2013, 11:36 AM

## 2013-10-02 NOTE — Discharge Instructions (Signed)
Cystoscopy, Care After   Refer to this sheet in the next few weeks. These instructions provide you with information on caring for yourself after your procedure. Your caregiver may also give you more specific instructions. Your treatment has been planned according to current medical practices, but problems sometimes occur. Call your caregiver if you have any problems or questions after your procedure.   HOME CARE INSTRUCTIONS   Things you can do to ease any discomfort after your procedure include:   Drinking enough water and fluids to keep your urine clear or pale yellow.   Taking a warm bath to relieve any burning feelings.  SEEK IMMEDIATE MEDICAL CARE IF:   You have an increase in blood in your urine.   You notice blood clots in your urine.   You have difficulty passing urine.   You have the chills.   You have abdominal pain.   You have a fever or persistent symptoms for more than 2 3 days.   You have a fever and your symptoms suddenly get worse.  MAKE SURE YOU:   Understand these instructions.   Will watch your condition.   Will get help right away if you are not doing well or get worse.  Document Released: 02/25/2005 Document Revised: 04/10/2013 Document Reviewed: 01/30/2012   [employer] Patient Information 2014 [employer].

## 2013-10-02 NOTE — OR Nursing (Signed)
1515 Patient c/o fullness in bladder that is uncomfortable. Dr. Junious Silk called for permission to irrigate catheter. 50cc 0.9% NaCl was irrigated into foley and over next 5 minutes 550cc of urine with blood clots drained into foley bag. Pt states this made him feel better.

## 2013-10-02 NOTE — Progress Notes (Signed)
Patient ID: Shawn [name], male   DOB: June 12, 1932, 80 y.o.   MRN: [id number]   Pt is in pre-op. I had a long discussion with pt wife and son. Anes has reviewed echo and ekg. They are going to [facility] for [name] in case of AAA rupture. I discussed risk of AAA rupture with family and the nature, potential benefits, risks and alternatives to TURBT, poss TURP, poss MMC, including side effects of the proposed treatment, the likelihood of the patient achieving the goals of the procedure, and any potential problems that might occur during the procedure or recuperation. All questions answered. Also discussed propensity of bladder cancer to recur and progress and need for repeat staging TUR/bx depending on path. Pt continues to have significant gross hematuria, require bladder irrigation and blood transfusions, therefore feel the risk of anesthesia and TURBT is acceptable.

## 2013-10-02 NOTE — Progress Notes (Signed)
I am told there are no beds at [facility]. Pt headed back to [facility]. His urine is clear.   Plan -  -d/c foley in AM if urine remains clear, light pink -if he remains stable, is voiding and his h/h stable he can be discharged home tomorrow afternoon from my point of view. He already has f/u with me on Monday, 10/07/2013.

## 2013-10-02 NOTE — Progress Notes (Signed)
LATE NOTE-[facility] PACU.  Report called at [phone]-RN unavailable.  Report called at 1915 to [facility].

## 2013-10-02 NOTE — Progress Notes (Addendum)
[facility] PROGRESS NOTE  Shawn [name] IRJ:[id number] DOB: 02-Oct-1931 DOA: 09/30/2013 PCP: Shawn Fellows, MD  Assessment/Plan: 80 y/o male smoker with PMH of dementia, HTN, TIA,  recent diagnoses of abdominal aortic aneurysm, and bladder mass presented with hematuria   1. Acute blood loss anemia due to hematuria;  -TF two units PRBC 2/10; TF platelets 2/10; monitor hg, TF prn; awaiting TURP. -Hb 8.8 today. -Continues to have hematuria  2. Thrombocytopenia likely consumptive with hematuria; Tf platelets 2/10; TF prn;   3. Advanced dementia not on meds, takes xanax for anxiety only   4. HTN not at meds at home; started low dose amlodipine;   5. Bladder mass/tumor with hematuria; CT: Urinary bladder demonstrates soft tissue density along the right  lateral and posterior wall compatible with known bladder tumor -per urology TURP today -Urine cx negative: will DC rocephin.  6. AAA: 6.2 cm infrarenal abdominal aortic aneurysm; defer management to vascular surgery,. appreciate the input    Not on heparin for DVT prophylaxis due to acute bleeding; cont SCD   Code Status: Full Family Communication: no family at bedside at time of my exam today. They have spoken with Dr. Junious [name] about GU procedure planned for today. Disposition Plan: To [facility] today for TURP and will stay there for CBI.   Consultants:  Vascular surgery  Urology   Procedures:  Pend turb   Antibiotics:  None  (indicate start date, and stop date if known)  HPI/Subjective: Somnolent. confused  Objective: Filed Vitals:   10/02/13 1228  BP: 130/62  Pulse: 63  Temp: 97.8 F (36.6 C)  Resp: 18    Intake/Output Summary (Last 24 hours) at 10/02/13 1315 Last data filed at 10/02/13 1230  Gross per 24 hour  Intake    460 ml  Output   2825 ml  Net  -2365 ml   Filed Weights   09/30/13 0641 10/01/13 0606  Weight: 82.5 kg (181 lb 14.1 oz) 83.9 kg (184 lb 15.5 oz)    Exam:   General:   somnolent.  Cardiovascular: s1,s2 rrr  Respiratory:  Poor ventilation in LL  Abdomen: soft, nt, nd   Musculoskeletal: no edema   Data Reviewed: Basic Metabolic Panel:  Recent Labs Lab 09/29/13 2035 09/30/13 0133 09/30/13 0138 10/01/13 0340 10/02/13 0323  NA 143 141 142 144 137  K 4.4 4.1 4.0 3.9 3.8  CL 104 103 102 105 101  CO2 26 24  --  24 23  GLUCOSE 123* 124* 119* 128* 135*  BUN 11 10 8 7 8   CREATININE 0.94 0.85 1.00 0.85 0.87  CALCIUM 9.1 8.6  --  8.6 8.3*   Liver Function Tests:  Recent Labs Lab 09/29/13 2035 09/30/13 0133  AST 22 17  ALT 14 11  ALKPHOS 66 60  BILITOT 0.4 0.4  PROT 7.2 6.8  ALBUMIN 4.0 3.8   No results found for this basename: LIPASE, AMYLASE,  in the last 168 hours No results found for this basename: AMMONIA,  in the last 168 hours CBC:  Recent Labs Lab 09/29/13 2035 09/30/13 0133  09/30/13 1050 09/30/13 1510 09/30/13 2240 10/01/13 0340 10/02/13 0323  WBC 6.3 5.2  --  7.9 6.9 5.4 7.4 9.4  NEUTROABS 4.2 3.4  --   --   --   --   --   --   HGB 8.0* 8.1*  < > 7.9* 7.5* 7.1* 7.7* 8.8*  HCT 24.5* 24.7*  < > 23.5* 22.5* 21.8* 23.9* 26.0*  MCV  91.1 91.5  --  91.4 91.5 91.2 91.9 88.7  PLT 59* 55*  --  45* 41* 46* 54* 66*  < > = values in this interval not displayed. Cardiac Enzymes: No results found for this basename: CKTOTAL, CKMB, CKMBINDEX, TROPONINI,  in the last 168 hours BNP (last 3 results) No results found for this basename: PROBNP,  in the last 8760 hours CBG: No results found for this basename: GLUCAP,  in the last 168 hours  Recent Results (from the past 240 hour(s))  URINE CULTURE     Status: None   Collection Time    09/30/13  1:30 AM      Result Value Ref Range Status   Specimen Description URINE, CLEAN CATCH   Final   Special Requests NONE   Final   Culture  Setup Time     Final   Value: 09/30/2013 08:40     Performed at [facility]     Final   Value: NO GROWTH     Performed at  [facility]   Culture     Final   Value: NO GROWTH     Performed at [facility]   Report Status 10/01/2013 FINAL   Final  MRSA PCR SCREENING     Status: None   Collection Time    10/01/13  9:35 PM      Result Value Ref Range Status   MRSA by PCR NEGATIVE  NEGATIVE Final   Comment:            The GeneXpert MRSA Assay (FDA     approved for NASAL specimens     only), is one component of a     comprehensive MRSA colonization     surveillance program. It is not     intended to diagnose MRSA     infection nor to guide or     monitor treatment for     MRSA infections.     Studies: Dg Chest 1 View  10/01/2013   CLINICAL DATA:  Cough  EXAM: CHEST - 1 VIEW  COMPARISON:  CT aorta abdomen pelvis 2015  FINDINGS: Heart size appears upper normal for portable technique. Pulmonary vascularity is not normal and hilar contours are normal. Lung volumes within normal limits. There is a 2.0 cm patchy opacity in the left infrahilar region. The right lung is clear.  IMPRESSION: Small focal opacity in the left infrahilar region. This could reflect focal atelectasis, focal airspace disease, or pulmonary scarring. A pulmonary nodule cannot be completely excluded. There is no prior chest imaging for comparison. Consider short-term follow-up two-view chest radiograph to evaluate for clearing.   Electronically Signed   By: Shawn [name] M.D.   On: 10/01/2013 10:08    Scheduled Meds: . amLODipine  2.5 mg Oral Daily  . beta carotene w/minerals  1 tablet Oral Daily  . cefTRIAXone (ROCEPHIN)  IV  1 g Intravenous Q24H  . mitoMYcin  40 mg Bladder Instillation Once  . tamsulosin  0.4 mg Oral QPC supper   Continuous Infusions:   Principal Problem:   Hematuria Active Problems:   Acute blood loss anemia   UTI (lower urinary tract infection)   Bladder mass   AAA (abdominal aortic aneurysm)   Alzheimer's dementia    Time spent: 35 minutes     Shawn [name]  [facility] Pager [phone]  If 7PM-7AM, please contact night-coverage at [email or web address], password [facility] 10/02/2013, 1:15 PM  LOS:  2 days

## 2013-10-03 ENCOUNTER — Encounter (HOSPITAL_COMMUNITY): Payer: Self-pay | Admitting: Urology

## 2013-10-03 MED ORDER — WHITE PETROLATUM GEL
Status: AC
  Administered 2013-10-03: 0.2
  Filled 2013-10-03: qty 5

## 2013-10-03 MED ORDER — TAMSULOSIN HCL 0.4 MG PO CAPS: 0.4000 mg | ORAL_CAPSULE | Freq: Every day | ORAL | Status: AC

## 2013-10-03 NOTE — Progress Notes (Signed)
Discharge home. Home discharge instruction given to wife, no question verbalized.

## 2013-10-03 NOTE — Discharge Summary (Signed)
Physician Discharge Summary  Shawn [name] BMW:[id number] DOB: December 06, 1931 DOA: 09/30/2013  PCP: Glenna Fellows, MD  Admit date: 09/30/2013 Discharge date: 10/03/2013  Time spent: 45 minutes  Recommendations for Outpatient Follow-up:  -Will be discharged home today. -Has follow up scheduled to see Dr. Junious Silk for early next week.   Discharge Diagnoses:  Principal Problem:   Hematuria Active Problems:   Acute blood loss anemia   UTI (lower urinary tract infection)   Bladder mass   AAA (abdominal aortic aneurysm)   Alzheimer's dementia   Discharge Condition: Stable and improved  Filed Weights   09/30/13 0641 10/01/13 0606 10/02/13 1929  Weight: 82.5 kg (181 lb 14.1 oz) 83.9 kg (184 lb 15.5 oz) 82.2 kg (181 lb 3.5 oz)    History of present illness:  80 y.o. Caucasian male with history of dementia, recent diagnoses of abdominal aortic aneurysm, and bladder mass who presents with the above complaints. Patient due to his dementia and cannot provide any history, most of the history was provided by wife at bedside. She indicated that patient had hematuria in December of 2014 which lasted for one week to 10 days. She followed up with Dr. Exie Parody, urology. Patient had CT done about 2 weeks ago which showed that he had abdominal aortic aneurysm measuring 59 x 57 mm and the tumor in his bladder wall. Due to patient's abdominal aortic aneurysm, urology deferred further urologic workup until abdominal aortic aneurysm was repaired. Wife are arranged for the patient to have an appointment with Dr. Donnetta Hutching, vascular surgery in the middle of February. In the meanwhile, patient restarted having hematuria early January initially was pink tinged since then has progressed bright red blood. As a result patient was brought to the emergency department for further evaluation. Workup in the emergency department showed infrarenal abdominal aortic aneurysm measuring 62 mm x 57 mm and bladder mass tumor. He was also found to  be anemic with a hemoglobin of 8.1. ED provider discussed with on-call urology who recommended medical admission.   Hospital Course:   1. Acute blood loss anemia due to hematuria;  -TF two units PRBC 2/10; TF platelets 2/10; monitor hg, TF prn; awaiting TURP.  -Hb remains stable around 8.8.  2. Thrombocytopenia likely consumptive with hematuria; Tf platelets 2/10; TF prn;   3. Advanced dementia not on meds, takes xanax for anxiety only   4. HTN not at meds at home;   5. Bladder mass/tumor with hematuria; CT: Urinary bladder demonstrates soft tissue density along the right lateral and posterior wall compatible with known bladder tumor  -per urology TURP 10/02/13. Will follow with GU next week in the office. -Urine cx negative: will DC rocephin.   6. AAA: 6.2 cm infrarenal abdominal aortic aneurysm; appreciate Vascular input. He does not recommend repair of the aneurysm given patient's age and dementia.     Procedures:  None   Consultations:  GU  Vascular Surgery  Discharge Instructions  Discharge Orders   Future Orders Complete By Expires   Discontinue IV  As directed    Increase activity slowly  As directed        Medication List         ALPRAZolam 0.5 MG tablet  Commonly known as:  XANAX  Take 0.5 mg by mouth 2 (two) times daily.     beta carotene w/minerals tablet  Take 1 tablet by mouth daily.     tamsulosin 0.4 MG Caps capsule  Commonly known as:  FLOMAX  Take  1 capsule (0.4 mg total) by mouth daily after supper.       No Known Allergies     Follow-up Information   Follow up with Fredricka Bonine, MD On 10/07/2013. (2:30 PM)    Specialty:  Urology   Contact information:   [facility]  [place] [address] [phone]       Follow up with Glenna Fellows, MD. Schedule an appointment as soon as possible for a visit in 2 weeks.   Specialty:  Neurosurgery   Contact information:   [address] [place] [address] [phone]        The results of significant diagnostics from this hospitalization (including imaging, microbiology, ancillary and laboratory) are listed below for reference.    Significant Diagnostic Studies: Dg Chest 1 View  10/01/2013   CLINICAL DATA:  Cough  EXAM: CHEST - 1 VIEW  COMPARISON:  CT aorta abdomen pelvis 2015  FINDINGS: Heart size appears upper normal for portable technique. Pulmonary vascularity is not normal and hilar contours are normal. Lung volumes within normal limits. There is a 2.0 cm patchy opacity in the left infrahilar region. The right lung is clear.  IMPRESSION: Small focal opacity in the left infrahilar region. This could reflect focal atelectasis, focal airspace disease, or pulmonary scarring. A pulmonary nodule cannot be completely excluded. There is no prior chest imaging for comparison. Consider short-term follow-up two-view chest radiograph to evaluate for clearing.   Electronically Signed   By: Curlene Dolphin M.D.   On: 10/01/2013 10:08   Ct Cta Abd/pel W/cm &/or W/o Cm  09/30/2013   CLINICAL DATA:  Hematuria in urinary retention. Abdominal aortic aneurysm.  EXAM: CT ANGIOGRAPHY ABDOMEN AND PELVIS WITH CONTRAST AND WITHOUT CONTRAST  TECHNIQUE: Multidetector CT imaging of the abdomen and pelvis was performed using the standard protocol during bolus administration of intravenous contrast. Multiplanar reconstructed images including MIPs were obtained and reviewed to evaluate the vascular anatomy.  CONTRAST:  191mL OMNIPAQUE IOHEXOL 350 MG/ML SOLN  COMPARISON:  09/18/2013.  FINDINGS: Lung bases show dependent atelectasis. Liver is grossly within normal limits. Sludge layers within a Phrygian cap in the gallbladder. The spleen appears within normal limits. Adrenal glands are normal. Kidneys appear within normal limits aside from a left renal cysts. Ureters appear normal without hydronephrosis or hydroureter. Grossly, the stomach and small bowel  appear normal. Pancreas and common bile duct are within normal limits.  Infrarenal abdominal aortic aneurysm is present. There is severe aortic and visceral atherosclerosis. 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 No occlusion are as evidence of mesenteric ischemia.  Infrarenal abdominal aortic aneurysm shows no evidence of rupture or leak. Aneurysm measures 62 mm x 57 mm at its largest. Previously this was reported at 16 mm and this is unchanged when reviewing the prior examination and measured at the same level as today's study. Extensive mural thrombus and plaque is present. The aorta assumes a more normal caliber at the bifurcation. Right common iliac artery ulcerated plaque is present. There is no dissection.  There is a right inguinal hernia that contains the appendix.  Urinary bladder demonstrates soft tissue density along the right lateral and posterior wall compatible with known bladder tumor. Prostatomegaly with prostate calcifications. No aggressive osseous lesions. Lumbar spondylosis. Bilateral hip osteoarthritis.  Review of the MIP images confirms the above findings.  IMPRESSION: 1. 62 mm x 57 mm infrarenal abdominal aortic aneurysm. No dissection or evidence of rupture. No acute vascular findings. 2. Bladder base tumor previously evaluated on CT urogram. 3. Right inguinal hernia containing the appendix.   Electronically Signed   By: Dereck Ligas M.D.   On: 09/30/2013 02:44    Microbiology: Recent Results (from the past 240 hour(s))  URINE CULTURE     Status: None   Collection Time    09/30/13  1:30 AM      Result Value Ref Range Status   Specimen Description URINE, CLEAN CATCH   Final   Special Requests NONE   Final   Culture  Setup Time     Final   Value: 09/30/2013 08:40     Performed at [facility]     Final   Value: NO GROWTH     Performed at  [facility]   Culture     Final   Value: NO GROWTH     Performed at [facility]   Report Status 10/01/2013 FINAL   Final  MRSA PCR SCREENING     Status: None   Collection Time    10/01/13  9:35 PM      Result Value Ref Range Status   MRSA by PCR NEGATIVE  NEGATIVE Final   Comment:            The GeneXpert MRSA Assay (FDA     approved for NASAL specimens     only), is one component of a     comprehensive MRSA colonization     surveillance program. It is not     intended to diagnose MRSA     infection nor to guide or     monitor treatment for     MRSA infections.     Labs: Basic Metabolic Panel:  Recent Labs Lab 09/29/13 2035 09/30/13 0133 09/30/13 0138 10/01/13 0340 10/02/13 0323  NA 143 141 142 144 137  K 4.4 4.1 4.0 3.9 3.8  CL 104 103 102 105 101  CO2 26 24  --  24 23  GLUCOSE 123* 124* 119* 128* 135*  BUN 11 10 8 7 8   CREATININE 0.94 0.85 1.00 0.85 0.87  CALCIUM 9.1 8.6  --  8.6 8.3*   Liver Function Tests:  Recent Labs Lab 09/29/13 2035 09/30/13 0133  AST 22 17  ALT 14 11  ALKPHOS 66 60  BILITOT 0.4 0.4  PROT 7.2 6.8  ALBUMIN 4.0 3.8   No results found for this basename: LIPASE, AMYLASE,  in the last 168 hours No results found for this basename: AMMONIA,  in the last 168 hours CBC:  Recent Labs Lab 09/29/13 2035 09/30/13 0133  09/30/13 1050 09/30/13 1510 09/30/13 2240 10/01/13 0340 10/02/13 0323 10/02/13 1625  WBC 6.3 5.2  --  7.9 6.9 5.4 7.4 9.4  --   NEUTROABS 4.2 3.4  --   --   --   --   --   --   --   HGB 8.0* 8.1*  < > 7.9* 7.5* 7.1* 7.7* 8.8* 8.9*  HCT 24.5* 24.7*  < > 23.5* 22.5* 21.8* 23.9* 26.0* 26.8*  MCV 91.1 91.5  --  91.4 91.5 91.2 91.9 88.7  --   PLT 59* 55*  --  45* 41* 46* 54* 66*  --   < > = values in this interval not displayed. Cardiac Enzymes: No results found for this basename: CKTOTAL,  CKMB, CKMBINDEX, TROPONINI,  in the last 168 hours BNP: BNP (last 3 results) No results found for this  basename: PROBNP,  in the last 8760 hours CBG: No results found for this basename: GLUCAP,  in the last 168 hours     Signed:  Lelon Frohlich  [facility] Pager: [phone] 10/03/2013, 2:29 PM

## 2013-10-03 NOTE — Progress Notes (Addendum)
Patient ID: Shawn [name], male   DOB: 04-07-1932, 80 y.o.   MRN: [id number] Stable. Comfortable. Says he once to go home today. Abdomen soft nontender. Have not and able to speak with the family. Certainly would not suggest treatment of his aneurysm do to his severe dementia. Would be happy to see the patient in the office to discuss this further with family if they are interested. Will not schedule active followup. Please let me know or schedule an office visit if the family once to discuss his aneurysm and the suggestion not to treat this asymptomatic disease in this severely demented 80 year old gentleman.

## 2013-10-03 NOTE — Progress Notes (Signed)
Patient ID: Shawn [name], male   DOB: 03-17-1932, 80 y.o.   MRN: [id number]  Pt without complaint. Foley out about 0730 per nurse.  Sleeping, NAD, easily arousable Abd - soft, Nt, bladder not distended  Imp/plan - -may D/c home from GU pt of view after pt voids.  -F/u with me arranged for Monday and on chart. Discussed with family, gave card, contact info, etc.

## 2013-10-06 LAB — TYPE AND SCREEN
ABO/RH(D): A POS
Antibody Screen: NEGATIVE
Unit division: 0
Unit division: 0

## 2013-10-08 ENCOUNTER — Encounter: Payer: Medicare Other | Admitting: Vascular Surgery

## 2013-10-22 ENCOUNTER — Telehealth: Payer: Self-pay

## 2013-10-22 NOTE — Telephone Encounter (Signed)
Discussed with Dr. Donnetta Hutching, question of need for rescheduling office consult for patient with 6.0 cm. AAA.  Per Dr. Donnetta Hutching, due to pt's severe dementia, and multiple health problems, he does not recommend surgery to repair the AAA.  Advised he would be happy to discuss this with pt's wife, if she has questions.  Phone call to pt's. wife.  Advised of Dr. Luther Parody recommendation.  Mrs. Condrey stated "Dr. Junious Silk, Urologist, will be following my husband every 3 mos., and he agrees with conservative treatment for the Abdominal aneurysm."  If Dr. Junious Silk feels he needs to come back to see Dr. Donnetta Hutching, then he will direct [place] to do so.  She requested to thank Dr. Donnetta Hutching for his care and concern.

## 2013-10-22 NOTE — Telephone Encounter (Signed)
Message copied by Denman [name] on Tue Oct 22, 2013  5:17 PM ------      Message from: Gena Fray      Created: Tue Oct 15, 2013  1:54 PM      Regarding: ? regarding consultation       Mr Dingley was scheduled for a consultation to discuss his 6.0 AAA with Dr Donnetta Hutching on 10/08/13. This appointment was canceled due to Mr Floren being admitted to the hospital.             I called patients wife in follow up to try and reschedule the consult.             Per patients wife:            Patient has dementia and now has been diagnosed with bladder cancer and is being followed by Dr Junious Silk with [facility]. From the wife's understanding, Dr Early and Dr Junious Silk have discussed this patients 6.0 AAA and do not feel, with all of his recent issues, that he would be a candidate for repair.             The patients wife wanted to make sure that she was correct in her understanding and would be happy to speak with Dr Donnetta Hutching or the clinical staff to clear up any confusion.             Thanks,       Hinton Dyer                   ------

## 2013-12-20 DEATH — deceased

## 2015-03-06 IMAGING — CT CT CTA ABD/PEL W/CM AND/OR W/O CM
2 of 7 series · 15 of 46 positions shown, 17 images · IV contrast (APPLIED)
Comparison: 09/18/2013.

CLINICAL DATA: Hematuria in urinary retention. Abdominal aortic
aneurysm.

EXAM:
CT ANGIOGRAPHY ABDOMEN AND PELVIS WITH CONTRAST AND WITHOUT CONTRAST
TECHNIQUE: Multidetector CT imaging of the abdomen and pelvis was performed
using the standard protocol during bolus administration of
intravenous contrast. Multiplanar reconstructed images including
MIPs were obtained and reviewed to evaluate the vascular anatomy.
CONTRAST:  100mL OMNIPAQUE IOHEXOL 350 MG/ML SOLN

[Series 4: dissection 2.0 i30f 3 · axial · 0.80mm/px · z∈[+859,+1267]mm · 12 of 238 slices shown, 14 images]
[im 17/238  soft-tissue]
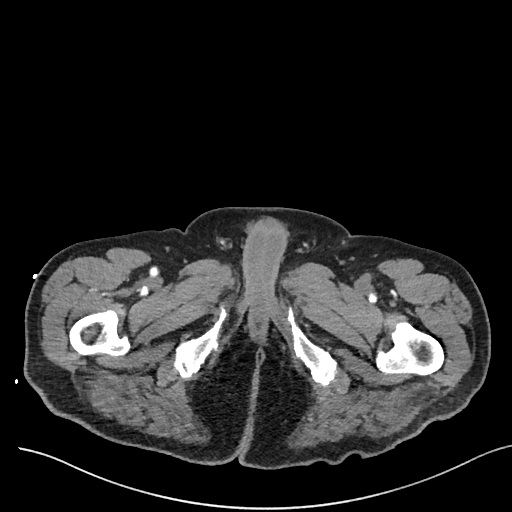
[im 17/238  bone]
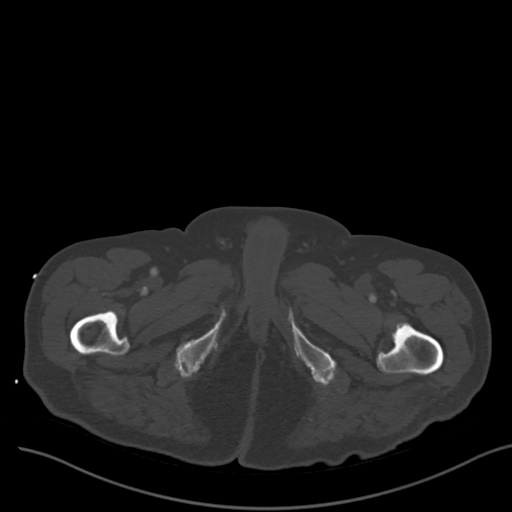
[im 33/238  soft-tissue]
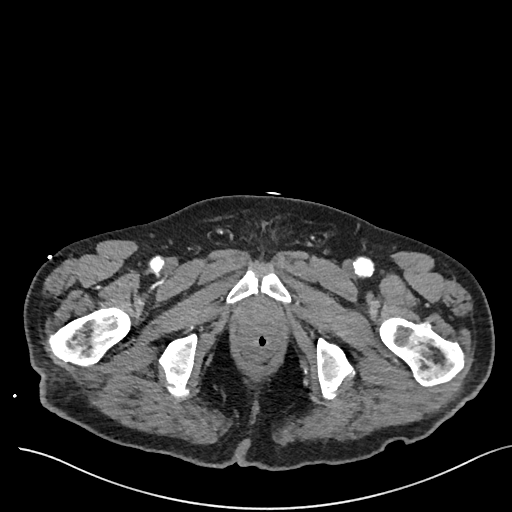
[im 50/238  soft-tissue]
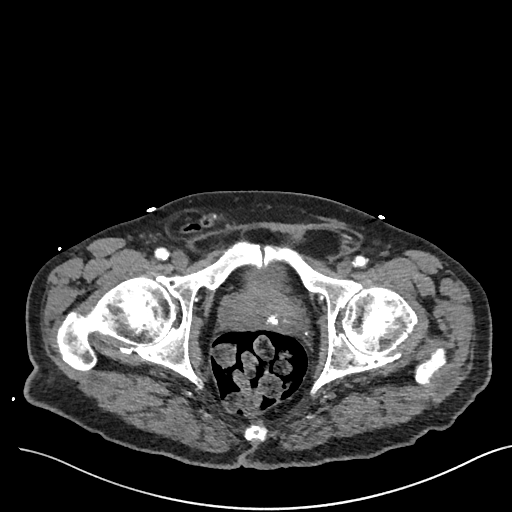
[im 74/238  soft-tissue]
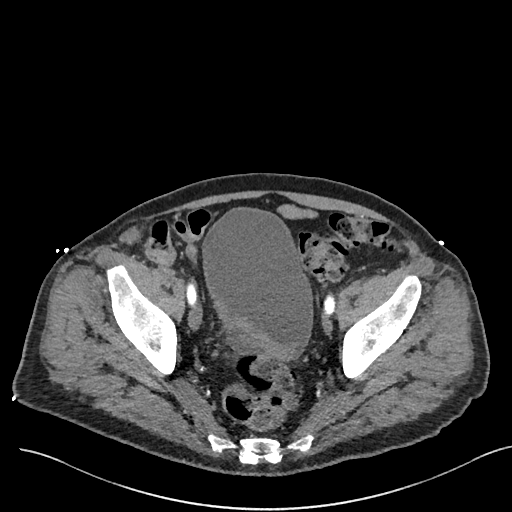
[im 90/238  soft-tissue]
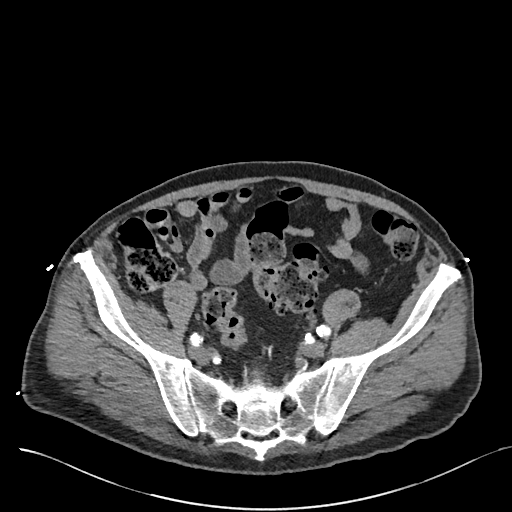
[im 107/238  soft-tissue]
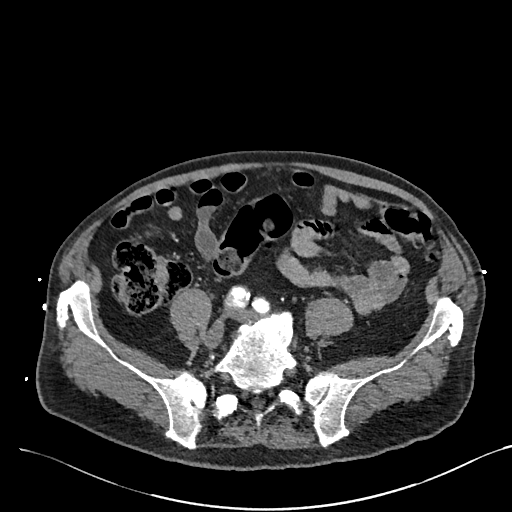
[im 131/238  soft-tissue]
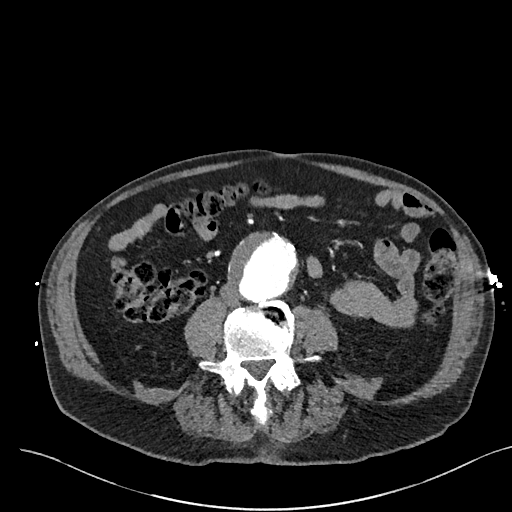
[im 148/238  soft-tissue]
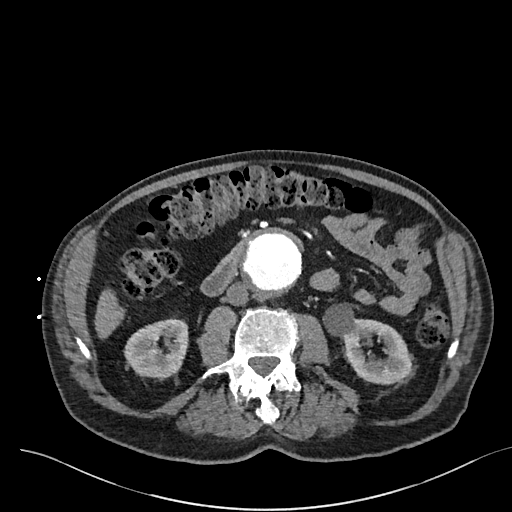
[im 164/238  soft-tissue]
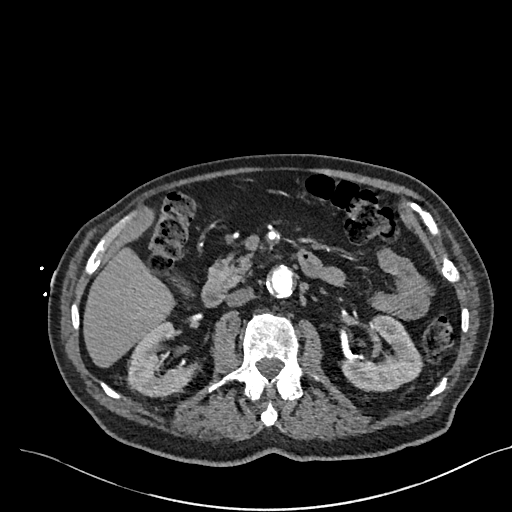
[im 164/238  bone]
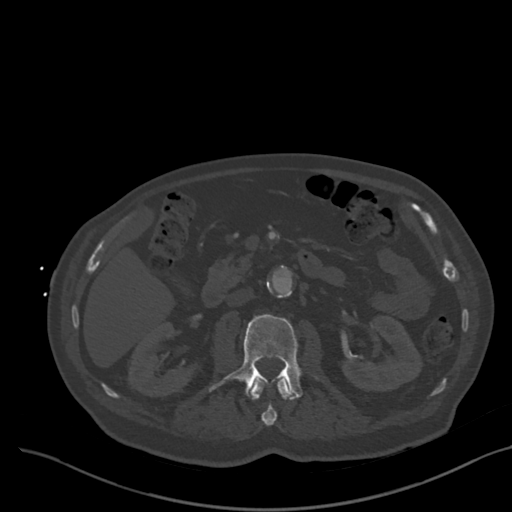
[im 188/238  soft-tissue]
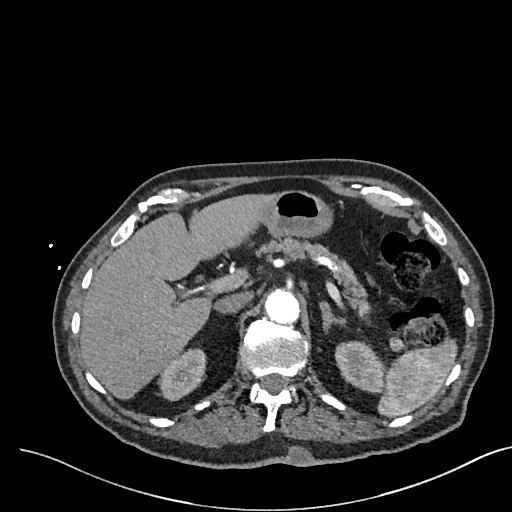
[im 205/238  soft-tissue]
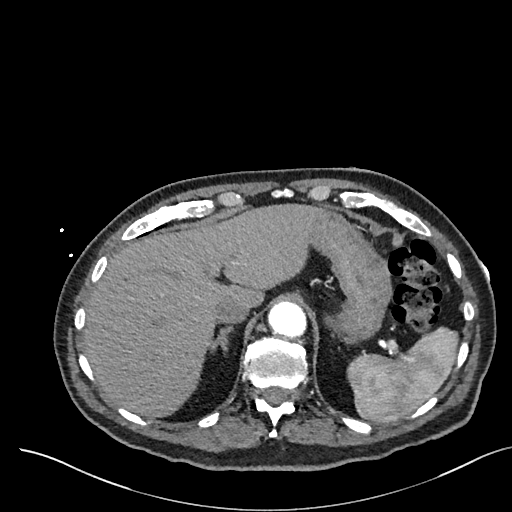
[im 221/238  soft-tissue]
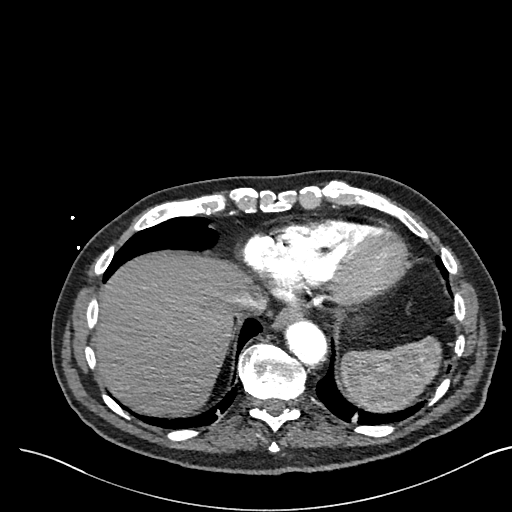

[Series 6: coronal mpr · coronal · 0.70mm/px · 3 of 120 slices shown]
[im 30/120  soft-tissue]
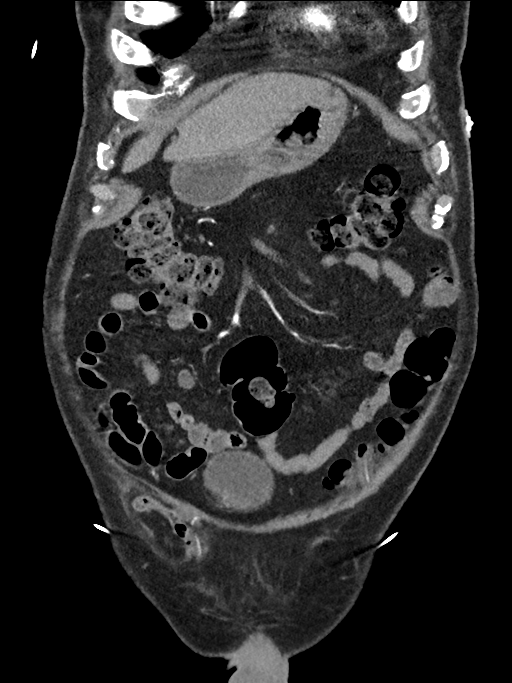
[im 60/120  soft-tissue]
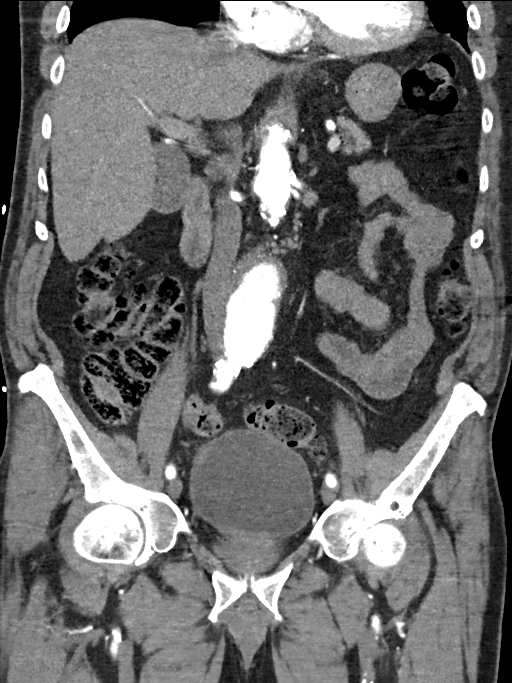
[im 90/120  soft-tissue]
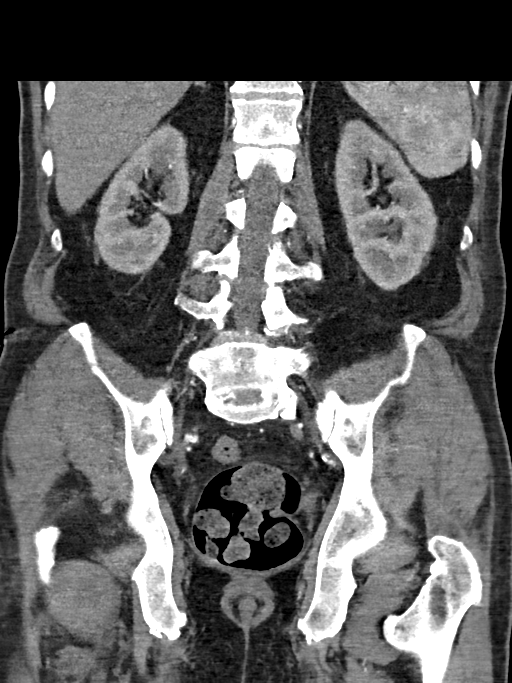

[15 of 46 positions shown; findings below may reference images not displayed]

FINDINGS: Lung bases show dependent atelectasis. Liver is grossly within
normal limits. Sludge layers within a Phrygian cap in the
gallbladder. The spleen appears within normal limits. Adrenal glands
are normal. Kidneys appear within normal limits aside from a left
renal cysts. Ureters appear normal without hydronephrosis or
hydroureter. Grossly, the stomach and small bowel appear normal.
Pancreas and common bile duct are within normal limits.

Infrarenal abdominal aortic aneurysm is present. There is severe
aortic and visceral atherosclerosis. There is a hemodynamically
significant stenosis of the superior mesenteric artery with greater
than 50% luminal narrowing in the small bowel mesentery. No
occlusion are as evidence of mesenteric ischemia.

Infrarenal abdominal aortic aneurysm shows no evidence of rupture or
leak. Aneurysm measures 62 mm x 57 mm at its largest. Previously
this was reported at 16 mm and this is unchanged when reviewing the
prior examination and measured at the same level as today's study.
Extensive mural thrombus and plaque is present. The aorta assumes a
more normal caliber at the bifurcation. Right common iliac artery
ulcerated plaque is present. There is no dissection.

There is a right inguinal hernia that contains the appendix.

Urinary bladder demonstrates soft tissue density along the right
lateral and posterior wall compatible with known bladder tumor.
Prostatomegaly with prostate calcifications. No aggressive osseous
lesions. Lumbar spondylosis. Bilateral hip osteoarthritis.

Review of the MIP images confirms the above findings.
IMPRESSION: 1. 62 mm x 57 mm infrarenal abdominal aortic aneurysm. No dissection
or evidence of rupture. No acute vascular findings.
2. Bladder base tumor previously evaluated on CT urogram.
3. Right inguinal hernia containing the appendix.

## 2015-03-07 IMAGING — CR DG CHEST 1V
1 series · 1 of 1 positions shown · non-contrast
Comparison: CT aorta abdomen pelvis 8829

CLINICAL DATA: Cough

EXAM:
CHEST - 1 VIEW

[AP]
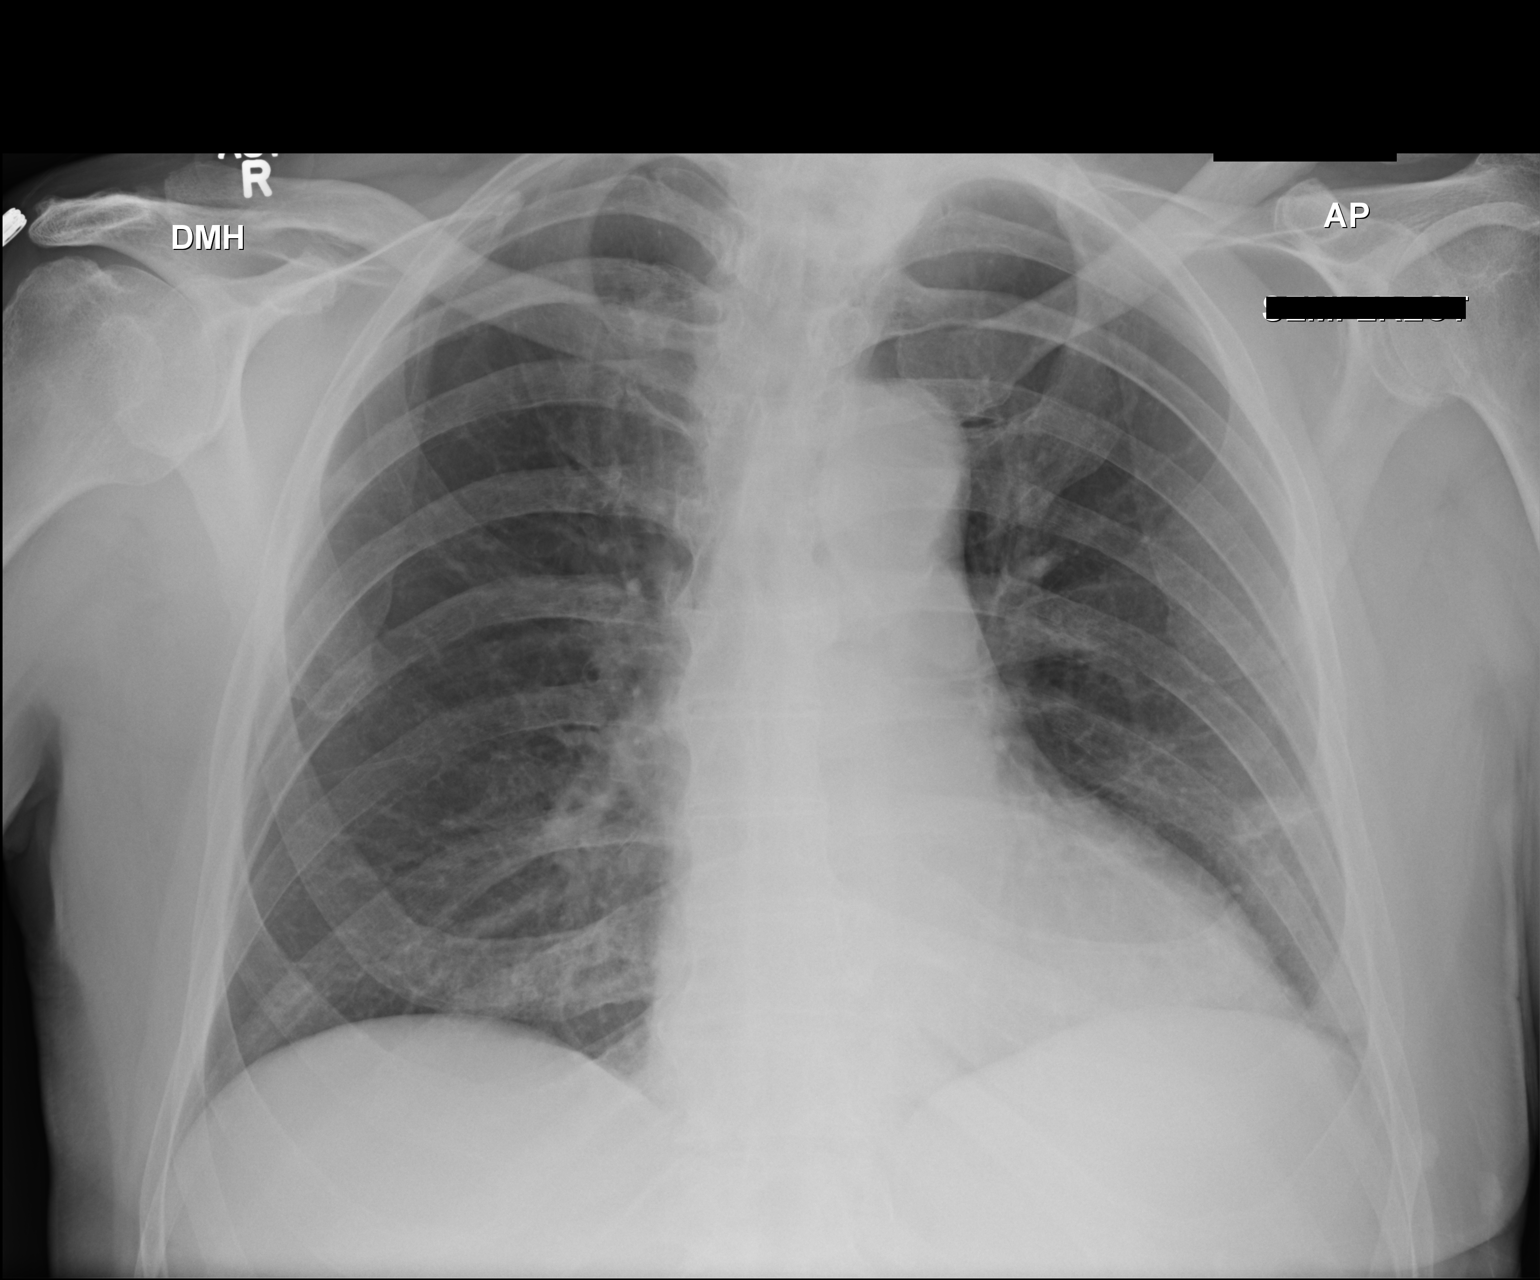

[1 of 1 positions shown; findings below may reference images not displayed]

FINDINGS: Heart size appears upper normal for portable technique. Pulmonary
vascularity is not normal and hilar contours are normal. Lung
volumes within normal limits. There is a 2.0 cm patchy opacity in
the left infrahilar region. The right lung is clear.
IMPRESSION: Small focal opacity in the left infrahilar region. This could
reflect focal atelectasis, focal airspace disease, or pulmonary
scarring. A pulmonary nodule cannot be completely excluded. There is
no prior chest imaging for comparison. Consider short-term follow-up
two-view chest radiograph to evaluate for clearing.
